# Patient Record
Sex: Female | Born: 1995 | Race: Black or African American | Hispanic: No | Marital: Single | Smoking: Former smoker
Health system: Southern US, Community
[De-identification: ages and names within clinical notes are randomized; demographics above are authoritative.]

## PROBLEM LIST (undated history)

## (undated) DIAGNOSIS — J45909 Unspecified asthma, uncomplicated: Secondary | ICD-10-CM

## (undated) DIAGNOSIS — D649 Anemia, unspecified: Secondary | ICD-10-CM

## (undated) DIAGNOSIS — T7840XA Allergy, unspecified, initial encounter: Secondary | ICD-10-CM

## (undated) HISTORY — DX: Allergy, unspecified, initial encounter: T78.40XA

## (undated) HISTORY — DX: Anemia, unspecified: D64.9

## (undated) HISTORY — PX: BREAST SURGERY: SHX581

## (undated) HISTORY — PX: WISDOM TOOTH EXTRACTION: SHX21

---
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
## 2016-02-22 ENCOUNTER — Encounter: Payer: Self-pay | Admitting: Emergency Medicine

## 2016-02-22 ENCOUNTER — Ambulatory Visit
Admission: EM | Admit: 2016-02-22 | Discharge: 2016-02-22 | Disposition: A | Discharge status: Home / Self Care | Payer: BLUE CROSS/BLUE SHIELD | Attending: Family Medicine | Admitting: Family Medicine

## 2016-02-22 DIAGNOSIS — R6889 Other general symptoms and signs: Secondary | ICD-10-CM

## 2016-02-22 DIAGNOSIS — R69 Illness, unspecified: Secondary | ICD-10-CM

## 2016-02-22 DIAGNOSIS — J111 Influenza due to unidentified influenza virus with other respiratory manifestations: Secondary | ICD-10-CM

## 2016-02-22 MED ORDER — MELOXICAM 15 MG PO TABS: 15.0000 mg | ORAL_TABLET | Freq: Every day | ORAL | 0 refills | Status: DC

## 2016-02-22 MED ORDER — BENZONATATE 200 MG PO CAPS: 200.0000 mg | ORAL_CAPSULE | Freq: Three times a day (TID) | ORAL | 0 refills | Status: DC | PRN

## 2016-02-22 MED ORDER — FEXOFENADINE-PSEUDOEPHED ER 180-240 MG PO TB24: 1.0000 | ORAL_TABLET | Freq: Every day | ORAL | 0 refills | Status: DC

## 2016-02-22 MED ORDER — OSELTAMIVIR PHOSPHATE 75 MG PO CAPS: 75.0000 mg | ORAL_CAPSULE | Freq: Two times a day (BID) | ORAL | 0 refills | Status: DC

## 2016-02-22 NOTE — ED Triage Notes (Signed)
Chills, fever, cough, runny nose. Congested, headache for 4 days

## 2016-02-22 NOTE — ED Provider Notes (Signed)
Laboratory dentition antibiotic flap was Assumed nasal congestion coughing  [facility]    CSN: [id number] Arrival date & time: 02/22/16  1755     History   Chief Complaint Chief Complaint  Patient presents with  . Influenza    HPI Deanna Barnes is a 21 y.o. female.   Dr. Thurmond ButtsWade doing dictation on this 21 year old black female who presents with nasal congestion coughing a pain and general malaise. She's also had a low-grade fever. Last week she did not feel well that she felt weak but she admits that she was on her cycle and that she's had a history of anemia. Things got better until Sunday. Sunday she states even since diagnosing a cough but it was on Monday morning when she had full-blown symptoms of flu achy all over coughing and nasal congestion sore throat and feeling miserable. Her mother repeatedly recommended go [place] Wednesday 2 days later she came in to be seen and evaluated. She has no pertinent past medical history no pertinent surgical history she never smoked. No known drug allergies. She's not been at work since Monday. No pertinent family medical history present.   The history is provided by the patient. No language interpreter was used.  Influenza  Presenting symptoms: cough, fever, headache, myalgias and rhinorrhea   Severity:  Moderate Progression:  Worsening Chronicity:  New Relieved by:  Nothing Worsened by:  Nothing Ineffective treatments:  None tried Associated symptoms: nasal congestion   Associated symptoms: no chills, no decreased appetite and no mental status change     History reviewed. No pertinent past medical history.  There are no active problems to display for this patient.   History reviewed. No pertinent surgical history.  OB History    No data available       Home Medications    Prior to Admission medications   Medication Sig Start Date End Date Taking? Authorizing Provider  benzonatate (TESSALON) 200 MG  capsule Take 1 capsule (200 mg total) by mouth 3 (three) times daily as needed. 02/22/16   Hassan RowanEugene Karyl Sharrar, MD  cyclobenzaprine (FLEXERIL) 10 MG tablet Take 1 tablet (10 mg total) by mouth 3 (three) times daily as needed for muscle spasms. 07/07/15   Darci Currentandolph N Brown, MD  fexofenadine-pseudoephedrine (ALLEGRA-D ALLERGY & CONGESTION) 180-240 MG 24 hr tablet Take 1 tablet by mouth daily. 02/22/16   Hassan RowanEugene Remigio Mcmillon, MD  fluconazole (DIFLUCAN) 150 MG tablet Take 1 tablet (150 mg total) by mouth once. Repeat the Diflucan in 2 weeks after the Flagyl 05/05/15   Hassan RowanEugene Mariana Wiederholt, MD  HYDROcodone-acetaminophen (NORCO/VICODIN) 5-325 MG tablet 1-2 tabs po qd prn 10/24/15   Payton Mccallumrlando Conty, MD  ketoconazole (NIZORAL) 2 % cream Apply 1 application topically 2 (two) times daily. 05/05/15   Hassan RowanEugene Osker Ayoub, MD  meloxicam (MOBIC) 15 MG tablet Take 1 tablet (15 mg total) by mouth daily. 02/22/16   Hassan RowanEugene Gohan Collister, MD  metroNIDAZOLE (FLAGYL) 500 MG tablet Take 1 tablet (500 mg total) by mouth 2 (two) times daily. 05/05/15   Hassan RowanEugene Matthew Pais, MD  oseltamivir (TAMIFLU) 75 MG capsule Take 1 capsule (75 mg total) by mouth 2 (two) times daily. 02/22/16   Hassan RowanEugene Egon Dittus, MD  terconazole (TERAZOL 3) 80 MG vaginal suppository Place 1 suppository (80 mg total) vaginally at bedtime. 05/05/15   Hassan RowanEugene Mende Biswell, MD    Family History No family history on file.  Social History Social History  Substance Use Topics  . Smoking status: Never Smoker  . Smokeless tobacco:  Never Used  . Alcohol use No     Allergies   Patient has no known allergies.   Review of Systems Review of Systems  Constitutional: Positive for fever. Negative for chills and decreased appetite.  HENT: Positive for congestion and rhinorrhea.   Respiratory: Positive for cough.   Musculoskeletal: Positive for myalgias.  Neurological: Positive for headaches.  All other systems reviewed and are negative.    Physical Exam Triage Vital Signs ED Triage Vitals  Enc Vitals Group     BP 02/22/16  1825 123/76     Pulse Rate 02/22/16 1825 88     Resp 02/22/16 1825 16     Temp 02/22/16 1825 99.4 F (37.4 C)     Temp Source 02/22/16 1825 Oral     SpO2 02/22/16 1825 100 %     Weight 02/22/16 1828 250 lb (113.4 kg)     Height 02/22/16 1828 5\' 8"  (1.727 m)     Head Circumference --      Peak Flow --      Pain Score 02/22/16 1829 0     Pain Loc --      Pain Edu? --      Excl. in GC? --    No data found.   Updated Vital Signs BP 123/76 (BP Location: Left Arm)   Pulse 88   Temp 99.4 F (37.4 C) (Oral)   Resp 16   Ht 5\' 8"  (1.727 m)   Wt 250 lb (113.4 kg)   LMP 02/19/2016   SpO2 100%   BMI 38.01 kg/m   Visual Acuity Right Eye Distance:   Left Eye Distance:   Bilateral Distance:    Right Eye Near:   Left Eye Near:    Bilateral Near:     Physical Exam  Constitutional: She is oriented to person, place, and time. She appears well-developed and well-nourished.  HENT:  Head: Normocephalic and atraumatic.  Right Ear: External ear normal.  Left Ear: External ear normal.  Mouth/Throat: Oropharynx is clear and moist.  Eyes: Pupils are equal, round, and reactive to light.  Neck: Normal range of motion. Neck supple.  Cardiovascular: Normal rate, regular rhythm and normal heart sounds.   Pulmonary/Chest: Effort normal and breath sounds normal.  Musculoskeletal: Normal range of motion.  Neurological: She is alert and oriented to person, place, and time.  Skin: Skin is warm and dry.  Psychiatric: She has a normal mood and affect.  Vitals reviewed.    UC Treatments / Results  Labs (all labs ordered are listed, but only abnormal results are displayed) Labs Reviewed - No data to display  EKG  EKG Interpretation None       Radiology No results found.  Procedures Procedures (including critical care time)  Medications Ordered in UC Medications - No data to display   Initial Impression / Assessment and Plan / UC Course  I have reviewed the triage vital signs  and the nursing notes.  Pertinent labs & imaging results that were available during my care of the patient were reviewed by me and considered in my medical decision making (see chart for details).   patient with flulike symptoms will treat with Tamiflu twice a day for 5 days Allegra-D daily and Tessalon Perles for cough and Mobic for the aches and pains. However follow-up PCP in 3-4 days not better work note given for today and tomorrow.,   Final Clinical Impressions(s) / UC Diagnoses   Final diagnoses:  Flu-like symptoms  Influenza-like illness    New Prescriptions New Prescriptions   BENZONATATE (TESSALON) 200 MG CAPSULE    Take 1 capsule (200 mg total) by mouth 3 (three) times daily as needed.   FEXOFENADINE-PSEUDOEPHEDRINE (ALLEGRA-D ALLERGY & CONGESTION) 180-240 MG 24 HR TABLET    Take 1 tablet by mouth daily.   MELOXICAM (MOBIC) 15 MG TABLET    Take 1 tablet (15 mg total) by mouth daily.   OSELTAMIVIR (TAMIFLU) 75 MG CAPSULE    Take 1 capsule (75 mg total) by mouth 2 (two) times daily.     Hassan Rowan, MD 02/22/16 [phone]

## 2016-10-01 ENCOUNTER — Ambulatory Visit
Admission: EM | Admit: 2016-10-01 | Discharge: 2016-10-01 | Disposition: A | Discharge status: Home / Self Care | Payer: Managed Care, Other (non HMO) | Attending: Family Medicine | Admitting: Family Medicine

## 2016-10-01 ENCOUNTER — Encounter: Payer: Self-pay | Admitting: Emergency Medicine

## 2016-10-01 DIAGNOSIS — N390 Urinary tract infection, site not specified: Secondary | ICD-10-CM

## 2016-10-01 DIAGNOSIS — R109 Unspecified abdominal pain: Secondary | ICD-10-CM

## 2016-10-01 LAB — URINALYSIS, COMPLETE (UACMP) WITH MICROSCOPIC
BILIRUBIN URINE: NEGATIVE
Glucose, UA: NEGATIVE mg/dL
Hgb urine dipstick: NEGATIVE
KETONES UR: NEGATIVE mg/dL
NITRITE: NEGATIVE
Protein, ur: NEGATIVE mg/dL
RBC / HPF: NONE SEEN RBC/hpf (ref 0–5)
Specific Gravity, Urine: >1.03 — ABNORMAL HIGH (ref 1.005–1.030)
pH: 5.5 (ref 5.0–8.0)

## 2016-10-01 LAB — PREGNANCY, URINE: Preg Test, Ur: NEGATIVE

## 2016-10-01 MED ORDER — CEPHALEXIN 500 MG PO CAPS: 500.0000 mg | ORAL_CAPSULE | Freq: Two times a day (BID) | ORAL | 0 refills | Status: AC

## 2016-10-01 NOTE — ED Provider Notes (Addendum)
[facility] ____________________________________________  Time seen: Approximately 9:18 AM  I have reviewed the triage vital signs and the nursing notes.   HISTORY  Chief Complaint GI Problem  HPI Deanna Barnes is a 21 y.o. female presenting for evaluation of stomach cramping that has been present for the last 3 days. 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 States she still is having abdominal cramping that comes and goes. States she is lactose intolerant and has been eating for cheese and dairy recently, which she expresses could be some of the discomfort. States she wanted to make sure she was fine. States some loose stool, but not diarrhea. States she has had two bowel movements today, and 3 yesterday. Denies vomiting or nausea. Denies known food trigger. 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 Denies sick contacts at home. Reports has continued to eat and drink well. Reports last menstrual was approximately 2 weeks ago, states she does not believe she is pregnant, but reports she and her boyfriend are not actively preventing pregnancy. States some urinary frequency, denies burning with urination or urinary urgency. Denies any vaginal discomfort, vaginal discharge. Denies vaginal bleeding. Has not been taking any over-the-counter medications for the same complaints. Denies other aggravating or alleviating factors. Reports otherwise feels well.  Denies chest pain, shortness of breath, extremity pain, extremity swelling or rash. Denies recent sickness. Denies recent antibiotic use.   Patient's last menstrual period was 09/13/2016 (exact date). Denies pregnancy.    Past medical history. Lactose Intolerance   There are no active problems to display for this patient.   History reviewed. No pertinent surgical history.   No current facility-administered medications for this encounter.    Current Outpatient Prescriptions:  .  cephALEXin (KEFLEX) 500 MG capsule, Take 1 capsule (500 mg total) by mouth 2 (two) times daily., Disp: 14 capsule, Rfl: 0  Allergies Patient has no known allergies.   family history Denies family with similar complaints.  Social History Social History  Substance Use Topics  . Smoking status: Never Smoker  . Smokeless tobacco: Never Used  . Alcohol use No    Review of Systems Constitutional: No fever/chills ENT: No sore throat. Cardiovascular: Denies chest pain. Respiratory: Denies shortness of breath. Gastrointestinal: As above.  Genitourinary: Negative for dysuria. Musculoskeletal: Negative for back pain. Skin: Negative for rash. ____________________________________________   PHYSICAL EXAM:  VITAL SIGNS: ED Triage Vitals  Enc Vitals Group     BP 10/01/16 0903 132/69     Pulse Rate 10/01/16 0903 71     Resp 10/01/16 0903 16     Temp 10/01/16 0903 98.2 F (36.8 C)     Temp Source 10/01/16 0903 Oral     SpO2 10/01/16 0903 100 %     Weight 10/01/16 0901 250 lb (113.4 kg)     Height 10/01/16 0901  (1.727 m)     Head Circumference --      Peak Flow --      Pain Score 10/01/16 0901 7     Pain Loc --      Pain Edu? --      Excl. in GC? --     Constitutional: Alert and oriented. Well appearing and in no acute distress. ENT      Head: Normocephalic and atraumatic.      Mouth/Throat: Mucous membranes are moist.Oropharynx non-erythematous. Cardiovascular: Normal rate, regular rhythm. Grossly  normal heart sounds.  Good peripheral circulation. Respiratory: Normal respiratory effort without tachypnea nor retractions. Breath sounds are clear and equal bilaterally. No wheezes, rales, rhonchi. Gastrointestinal: Minimal diffuse tenderness. No point tenderness. Normal Bowel sounds. No CVA tenderness. Musculoskeletal:  Steady gait.  No midline cervical, thoracic or lumbar tenderness to palpation. Neurologic:  Normal speech and  language.  Speech is normal. No gait instability.  Skin:  Skin is warm, dry. Psychiatric: Mood and affect are normal. Speech and behavior are normal. Patient exhibits appropriate insight and judgment   ___________________________________________   LABS (all labs ordered are listed, but only abnormal results are displayed)  Labs Reviewed  URINALYSIS, COMPLETE (UACMP) WITH MICROSCOPIC - Abnormal; Notable for the following:       Result Value   APPearance HAZY (*)    Specific Gravity, Urine >1.030 (*)    Leukocytes, UA SMALL (*)    Squamous Epithelial / LPF 6-30 (*)    Bacteria, UA MANY (*)    All other components within normal limits  URINE CULTURE  PREGNANCY, URINE    RADIOLOGY  No results found. ____________________________________________   PROCEDURES Procedures   INITIAL IMPRESSION / ASSESSMENT AND PLAN / ED COURSE  Pertinent labs & imaging results that were available during my care of the patient were reviewed by me and considered in my medical decision making (see chart for details).  Well-appearing patient. No acute distress. Urine pregnancy negative. Urine reviewed, concern for UTI. Will culture urine and empirically start patient on oral Keflex. Encouraged rest, fluids, avoidance of lactose foods. Discussed with patient, as long as no diarrhea, vomiting or fever may return to work tomorrow. Encourage for any continued or increased abdominal complaints seek reevaluation. Discussed indication, risks and benefits of medications with patient.  Discussed follow up with Primary care physician this week. Discussed follow up and return parameters including no resolution or any worsening concerns. Patient verbalized understanding and agreed to plan.   ____________________________________________   FINAL CLINICAL IMPRESSION(S) / ED DIAGNOSES  Final diagnoses:  Urinary tract infection without hematuria, site unspecified  Stomach discomfort     Discharge Medication List  as of 10/01/2016  9:49 AM    START taking these medications   Details  cephALEXin (KEFLEX) 500 MG capsule Take 1 capsule (500 mg total) by mouth 2 (two) times daily., Starting Mon 10/01/2016, Until Mon 10/08/2016, Normal        Note: This dictation was prepared with Dragon dictation along with smaller phrase technology. Any transcriptional errors that result from this process are unintentional.         Renford DillsMiller, Kennadi Albany, NP 10/01/16 1101    Renford DillsMiller, Ishia Tenorio, NP 10/01/16 1101

## 2016-10-01 NOTE — ED Triage Notes (Signed)
Patient c/o stomach cramps that started over the weekend.  Patient denies N/V/D.  Patient last bowel movement was this morning and was normal.

## 2016-10-01 NOTE — Discharge Instructions (Signed)
Take medication as prescribed. Rest. Drink plenty of fluids.  ° °Follow up with your primary care physician this week as needed. Return to Urgent care for new or worsening concerns.  ° °

## 2016-10-02 LAB — URINE CULTURE: CULTURE: NO GROWTH

## 2016-10-29 ENCOUNTER — Ambulatory Visit (INDEPENDENT_AMBULATORY_CARE_PROVIDER_SITE_OTHER): Payer: Managed Care, Other (non HMO)

## 2016-10-29 ENCOUNTER — Ambulatory Visit
Admission: EM | Admit: 2016-10-29 | Discharge: 2016-10-29 | Disposition: A | Discharge status: Home / Self Care | Payer: Managed Care, Other (non HMO) | Attending: Family Medicine | Admitting: Family Medicine

## 2016-10-29 ENCOUNTER — Encounter: Payer: Self-pay | Admitting: *Deleted

## 2016-10-29 DIAGNOSIS — S6991XA Unspecified injury of right wrist, hand and finger(s), initial encounter: Secondary | ICD-10-CM | POA: Diagnosis not present

## 2016-10-29 DIAGNOSIS — M79644 Pain in right finger(s): Secondary | ICD-10-CM | POA: Diagnosis not present

## 2016-10-29 DIAGNOSIS — W231XXA Caught, crushed, jammed, or pinched between stationary objects, initial encounter: Secondary | ICD-10-CM

## 2016-10-29 MED ORDER — TRAMADOL HCL 50 MG PO TABS: 50.0000 mg | ORAL_TABLET | Freq: Three times a day (TID) | ORAL | 0 refills | Status: DC | PRN

## 2016-10-29 NOTE — Discharge Instructions (Signed)
No fracture.  Ice. You can use Ibuprofen 800 mg if you like.  Tramadol for pain.  Take care  Dr. Adriana Simas

## 2016-10-29 NOTE — ED Provider Notes (Signed)
[facility]    CSN: [id number] Arrival date & time: 10/29/16  1710  History   Chief Complaint Chief Complaint  Patient presents with  . Finger Injury   HPI  21 year old female presents with the above complaint.  Patient states that earlier today, she accidentally closed her right index finger in a car door. She subsequently developed severe pain and swelling of the distal portion of the finger. Pain is severe. Very tender. She has been icing the area. No other medications or interventions tried. No reports of obvious open fracture. No other associated symptoms. No other complaints or concerns at this time.  PMH - Asthma, Anemia  History reviewed. No pertinent surgical history.  OB History    No data available     Home Medications    Family History History reviewed. No pertinent family history.  Social History Social History  Substance Use Topics  . Smoking status: Never Smoker  . Smokeless tobacco: Never Used  . Alcohol use No    Allergies   Patient has no known allergies.   Review of Systems Review of Systems  Constitutional: Negative.   Musculoskeletal:       Finger injury, swelling, pain.   Physical Exam Triage Vital Signs ED Triage Vitals [10/29/16 1718]  Enc Vitals Group     BP 138/84     Pulse Rate 84     Resp 16     Temp 98.3 F (36.8 C)     Temp Source Oral     SpO2 100 %     Weight 240 lb (108.9 kg)     Height  (1.727 m)     Head Circumference      Peak Flow      Pain Score 10     Pain Loc      Pain Edu?      Excl. in GC?    Updated Vital Signs BP 138/84 (BP Location: Left Arm)   Pulse 84   Temp 98.3 F (36.8 C) (Oral)   Resp 16   Ht  (1.727 m)   Wt 240 lb (108.9 kg)   LMP 10/15/2016 (Approximate) Comment: denies preg  SpO2 100%   BMI 36.49 kg/m   Physical Exam  Constitutional: She is oriented to person, place, and time. She appears well-developed. No distress.  Pulmonary/Chest: Effort normal. No  respiratory distress.  Musculoskeletal:  Right index finger - no tenderness at the MCP, PIP joints. No discrete tenderness at the DIP joint. She has significant tenderness and swelling distal to the DIP joint. Small subungual hematoma. Nail intact. Neurovascularly intact.  Neurological: She is alert and oriented to person, place, and time.  Psychiatric: She has a normal mood and affect.  Vitals reviewed.  UC Treatments / Results  Labs (all labs ordered are listed, but only abnormal results are displayed) Labs Reviewed - No data to display  EKG  EKG Interpretation None      Radiology Dg Finger Index Right  Result Date: 10/29/2016 CLINICAL DATA:  Shut right index finger in a car door. EXAM: RIGHT INDEX FINGER 2+V COMPARISON:  None. FINDINGS: Negative for fracture or dislocation. Alignment is normal. No focal soft tissue abnormality. No evidence for a radiopaque foreign body. IMPRESSION: No acute bone abnormality. Electronically Signed   By: Richarda Overlie M.D.   On: 10/29/2016 18:03    Procedures Procedures (including critical care time)  Medications Ordered in UC Medications - No data to display   Initial  Impression / Assessment and Plan / UC Course  I have reviewed the triage vital signs and the nursing notes.  Pertinent labs & imaging results that were available during my care of the patient were reviewed by me and considered in my medical decision making (see chart for details).    21 year old female presents with a finger injury. X-ray negative. Small subungual hematoma that I do not feel needs to be treated. Tramadol as needed for pain. Ice and elevation.  Final Clinical Impressions(s) / UC Diagnoses   Final diagnoses:  Injury of finger of right hand, initial encounter   New Prescriptions New Prescriptions   TRAMADOL (ULTRAM) 50 MG TABLET    Take 1 tablet (50 mg total) by mouth every 8 (eight) hours as needed.   Controlled Substance Prescriptions [place] Controlled  Substance Registry consulted? Yes, I have consulted the [place] Controlled Substances Registry for this patient. No recent narcotics. Okay to proceed with Tramadol. # 15 given.   Tommie Sams, [place] 10/29/16 1821

## 2016-10-29 NOTE — ED Triage Notes (Signed)
Closed right index finger in car door today. Right index finger now painful and edematous.

## 2016-12-23 ENCOUNTER — Other Ambulatory Visit: Payer: Self-pay

## 2016-12-23 ENCOUNTER — Ambulatory Visit
Admission: EM | Admit: 2016-12-23 | Discharge: 2016-12-23 | Disposition: A | Discharge status: Home / Self Care | Payer: Managed Care, Other (non HMO) | Attending: Emergency Medicine | Admitting: Emergency Medicine

## 2016-12-23 DIAGNOSIS — M62830 Muscle spasm of back: Secondary | ICD-10-CM

## 2016-12-23 MED ORDER — METHOCARBAMOL 750 MG PO TABS: 750.0000 mg | ORAL_TABLET | ORAL | 0 refills | Status: DC

## 2016-12-23 MED ORDER — IBUPROFEN 600 MG PO TABS: 600.0000 mg | ORAL_TABLET | Freq: Four times a day (QID) | ORAL | 0 refills | Status: DC | PRN

## 2016-12-23 NOTE — Discharge Instructions (Signed)
600 mg of ibuprofen with 1 g of Tylenol 3-4 times a day together as needed for pain.  You may try some deep tissue massage in addition to gentle stretching.  Try the Robaxin, which is a muscle relaxant.  Follow-up here with a primary care physician of your choice if not better in 1 week.  See list below.  Here is a list of primary care providers who are taking new patients:  Dr. Elizabeth Sauereanna Jones, Dr. Schuyler AmorWilliam Plonk [address] [place] [address] [phone]  [facility] [place] [address]  [place] [address]  [phone]  [facility] [address]  [place], [place] [address] [phone]  [facility] [place] [address]  [phone] [place], [place] [address]  Here are clinics/ other resources who will see you if you do not have insurance. Some have certain criteria that you must meet. Call them and find out what they are:  [facility]: [address] [place] [address] [place], [place] [address] Phone: [phone] Hours: First and Third Saturdays of each Month, 9 a.m. - 1 p.m.  [facility]: [address], [place], [place] [address] Phone: [phone] Hours: Tuesday, 4 p.m. - 8 p.m. Thursday, 1 p.m. - 8 p.m. Wednesday, 9 a.m. - [facility]  [facility] [address], [place], [place] [address] Phone: [phone] Pharmacy Phone Number: [phone] Dental Phone Number: [phone] [facility] Insurance Help: [phone]  Dental Hours: Monday - Thursday, 8 a.m. - 6 p.m.  [facility] [address] [place], [place] [address] Phone: [phone] Pharmacy Phone Number: [phone] [facility] Insurance Help: [phone]  [facility] [address] [place], [place] [address] Phone: [phone] Pharmacy Phone Number: [phone] [facility] Insurance Help: [phone]  [facility] [address] [place], [place] [address] Phone: [phone] [facility] Insurance  Help: [phone]   [facility] [address] [place], [place] [address] Phone: [phone]  Go to www.goodrx.com to look up your medications. This will give you a list of where you can find your prescriptions at the most affordable prices. Or ask the pharmacist what the cash price is, or if they have any other discount programs available to help make your medication more affordable. This can be less expensive than what you would pay with insurance.

## 2016-12-23 NOTE — ED Triage Notes (Signed)
Patient states she has woke up thursday and noticed left upper back pain radiating to shoulder. Patient states she thought she slept the wrong way. Pain is worse when laying down.

## 2016-12-23 NOTE — ED Provider Notes (Signed)
HPI  SUBJECTIVE:  Deanna Barnes is a 21 y.o. female who presents with 4 days of left upper back and shoulder pain described as stabbing, sharp, alternating with dull.  States that she feels as if she "pulled something".  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 She denies any trauma to her back.  She denies neck pain, chest pain, shortness of breath, fevers.  She reports some nasal congestion, rhinorrhea and cough, but no wheezing.  No numbness or tingling in her arm, no grip weakness.  No symptoms like this previously.  No alleviating factors.  She tried stretching.  Symptoms are worse with lying down on her left side, coughing.  It is not particularly associated with arm, neck movement or torso rotation.  Past medical history negative for diabetes, hypertension, MI, hypercholesterolemia.  LMP: 11/8.  Denies possibility of being pregnant.  PMD: None.  History reviewed. No pertinent past medical history.  History reviewed. No pertinent surgical history.  History reviewed. No pertinent family history.  Social History   Tobacco Use  . Smoking status: Never Smoker  . Smokeless tobacco: Never Used  Substance Use Topics  . Alcohol use: No  . Drug use: No    No current facility-administered medications for this encounter.   Current Outpatient Medications:  .  ibuprofen (ADVIL,MOTRIN) 600 MG tablet, Take 1 tablet (600 mg total) by mouth every 6 (six) hours as needed., Disp: 30 tablet, Rfl: 0 .  methocarbamol (ROBAXIN) 750 MG tablet, Take 1 tablet (750 mg total) by mouth every 4 (four) hours., Disp: 40 tablet, Rfl: 0  No Known Allergies   ROS  As noted in HPI.   Physical Exam  BP 125/69 (BP Location: Left Arm)   Pulse 82   Temp 98.6 F (37 C) (Oral)   Ht 5\' 8"  (1.727 m)   Wt 240 lb (108.9 kg)   LMP 11/26/2016 (Approximate)   SpO2 100%   BMI 36.49 kg/m   Constitutional: Well developed, well nourished, no acute  distress.  Obese. Eyes:  EOMI, conjunctiva normal bilaterally HENT: Normocephalic, atraumatic,mucus membranes moist Respiratory: Normal inspiratory effort Cardiovascular: Normal rate GI: nondistended skin: No rash the area of pain, skin intact Musculoskeletal: Tenderness over the scapula along teres minor.  Positive muscle spasm.  No rhomboid, trapezius muscle tenderness.  No tenderness over the latissimus dorsi.  No C-spine, T-spine tenderness.  No pain with range of motion of her neck or shoulder. Neurologic: Alert & oriented x 3, no focal neuro deficits Psychiatric: Speech and behavior appropriate   ED Course   Medications - No data to display  No orders of the defined types were placed in this encounter.   No results found for this or any previous visit (from the past 24 hour(s)). No results found.  ED Clinical Impression  Muscle spasm of back   ED Assessment/Plan  Presentation consistent with musculoskeletal cause of her symptoms.  Plan to send home with ibuprofen 600 mg with 1 g of Tylenol, Robaxin.  She will follow-up with a primary care or provider of her choice or return here if not better in 5-7 days.  Ordering primary care referral and will provide primary care referral list.  She will go to the ER if she gets worse.  Discussed  MDM, plan and followup with patient. Discussed sn/sx that should prompt return to the ED. patient agrees with plan.   Meds ordered this encounter  Medications  . methocarbamol (ROBAXIN) 750 MG tablet    Sig: Take 1 tablet (750 mg total) by mouth every 4 (four) hours.    Dispense:  40 tablet    Refill:  0  . ibuprofen (ADVIL,MOTRIN) 600 MG tablet    Sig: Take 1 tablet (600 mg total) by mouth every 6 (six) hours as needed.    Dispense:  30 tablet    Refill:  0    *This clinic note was created using Scientist, clinical (histocompatibility and immunogenetics)Dragon dictation software. Therefore, there may be occasional mistakes despite careful proofreading.   ?   Domenick GongMortenson, Elvin Mccartin,  MD 12/23/16 1745

## 2017-04-06 ENCOUNTER — Other Ambulatory Visit: Payer: Self-pay

## 2017-04-06 ENCOUNTER — Ambulatory Visit
Admission: EM | Admit: 2017-04-06 | Discharge: 2017-04-06 | Disposition: A | Discharge status: Home / Self Care | Payer: BLUE CROSS/BLUE SHIELD | Attending: Family Medicine | Admitting: Family Medicine

## 2017-04-06 ENCOUNTER — Encounter: Payer: Self-pay | Admitting: Gynecology

## 2017-04-06 DIAGNOSIS — Z202 Contact with and (suspected) exposure to infections with a predominantly sexual mode of transmission: Secondary | ICD-10-CM

## 2017-04-06 DIAGNOSIS — Z3202 Encounter for pregnancy test, result negative: Secondary | ICD-10-CM

## 2017-04-06 DIAGNOSIS — Z113 Encounter for screening for infections with a predominantly sexual mode of transmission: Secondary | ICD-10-CM | POA: Diagnosis not present

## 2017-04-06 HISTORY — DX: Unspecified asthma, uncomplicated: J45.909

## 2017-04-06 LAB — WET PREP, GENITAL
Clue Cells Wet Prep HPF POC: NONE SEEN
Sperm: NONE SEEN
Trich, Wet Prep: NONE SEEN
Yeast Wet Prep HPF POC: NONE SEEN

## 2017-04-06 LAB — PREGNANCY, URINE: Preg Test, Ur: NEGATIVE

## 2017-04-06 LAB — CHLAMYDIA/NGC RT PCR (ARMC ONLY)
CHLAMYDIA TR: NOT DETECTED
N gonorrhoeae: NOT DETECTED

## 2017-04-06 NOTE — ED Provider Notes (Signed)
[facility]    CSN: [id number] Arrival date & time: 04/06/17  1322     History   Chief Complaint Chief Complaint  Patient presents with  . SEXUALLY TRANSMITTED DISEASE    HPI Deanna Barnes is a 22 y.o. female.   Presents to the urgent care facility for evaluation of possible STD exposure.  Patient states she is just getting out of a relationship with her sexual partner.  She is not having any symptoms.  She denies any pain, vaginal discharge, bleeding.  She states she is late from her last menstrual period.  States she is approximately 11 days late.  HPI  Past Medical History:  Diagnosis Date  . Asthma     There are no active problems to display for this patient.   Past Surgical History:  Procedure Laterality Date  . WISDOM TOOTH EXTRACTION      OB History    No data available       Home Medications    Prior to Admission medications   Medication Sig Start Date End Date Taking? Authorizing Provider  ibuprofen (ADVIL,MOTRIN) 600 MG tablet Take 1 tablet (600 mg total) by mouth every 6 (six) hours as needed. 12/23/16  Yes Domenick GongMortenson, Ashley, MD  methocarbamol (ROBAXIN) 750 MG tablet Take 1 tablet (750 mg total) by mouth every 4 (four) hours. 12/23/16  Yes Domenick GongMortenson, Ashley, MD    Family History Family History  Problem Relation Age of Onset  . Asthma Mother   . Hypertension Father     Social History Social History   Tobacco Use  . Smoking status: Never Smoker  . Smokeless tobacco: Never Used  Substance Use Topics  . Alcohol use: No  . Drug use: Yes    Types: Marijuana     Allergies   Patient has no known allergies.   Review of Systems Review of Systems  Constitutional: Negative for fever.  Gastrointestinal: Negative for abdominal pain.  Genitourinary: Negative for dysuria, flank pain, frequency, menstrual problem, pelvic pain, vaginal bleeding, vaginal discharge and vaginal pain.  Skin: Negative for rash.  Neurological: Negative for  headaches.     Physical Exam Triage Vital Signs ED Triage Vitals  Enc Vitals Group     BP 04/06/17 1352 124/79     Pulse Rate 04/06/17 1352 98     Resp 04/06/17 1352 18     Temp 04/06/17 1352 98.7 F (37.1 C)     Temp Source 04/06/17 1352 Oral     SpO2 04/06/17 1352 100 %     Weight 04/06/17 1350 280 lb (127 kg)     Height 04/06/17 1350 5\' 8"  (1.727 m)     Head Circumference --      Peak Flow --      Pain Score 04/06/17 1349 0     Pain Loc --      Pain Edu? --      Excl. in GC? --    No data found.  Updated Vital Signs BP 124/79 (BP Location: Left Arm)   Pulse 98   Temp 98.7 F (37.1 C) (Oral)   Resp 18   Ht 5\' 8"  (1.727 m)   Wt 280 lb (127 kg)   LMP 02/27/2017   SpO2 100%   BMI 42.57 kg/m   Visual Acuity Right Eye Distance:   Left Eye Distance:   Bilateral Distance:    Right Eye Near:   Left Eye Near:    Bilateral Near:  Physical Exam  Constitutional: She is oriented to person, place, and time. She appears well-developed and well-nourished.  HENT:  Head: Normocephalic and atraumatic.  Eyes: Conjunctivae are normal.  Neck: Normal range of motion.  Cardiovascular: Normal rate.  Pulmonary/Chest: Effort normal. No respiratory distress.  Musculoskeletal: Normal range of motion.  Neurological: She is alert and oriented to person, place, and time.  Skin: Skin is warm. No rash noted.  Psychiatric: She has a normal mood and affect. Her behavior is normal. Thought content normal.     UC Treatments / Results  Labs (all labs ordered are listed, but only abnormal results are displayed) Labs Reviewed  WET PREP, GENITAL - Abnormal; Notable for the following components:      Result Value   WBC, Wet Prep HPF POC MODERATE (*)    All other components within normal limits  CHLAMYDIA/NGC RT PCR ([facility] ONLY)  PREGNANCY, URINE  RPR  HIV ANTIBODY (ROUTINE TESTING)    EKG  EKG Interpretation None       Radiology No results  found.  Procedures Procedures (including critical care time)  Medications Ordered in UC Medications - No data to display   Initial Impression / Assessment and Plan / UC Course  I have reviewed the triage vital signs and the nursing notes.  Pertinent labs & imaging results that were available during my care of the patient were reviewed by me and considered in my medical decision making (see chart for details).     22 year old female with possible exposure to STD.  She is coming out of a relationship with her partner and would like to be checked for HIV, STDs, wet prep and pregnancy test.  Urine pregnancy test is negative.  Wet prep showed no clue cells, trichomonas but did show some white blood cells.  Patient denies any symptoms of dysuria, vaginal discharge, pain.  HIV, RPR, gonorrhea, chlamydia results will result in the next couple of days, we will notify patient of results.  Final Clinical Impressions(s) / UC Diagnoses   Final diagnoses:  Possible exposure to STD    ED Discharge Orders    None         Evon Slack, [place] 04/06/17 1428

## 2017-04-06 NOTE — ED Triage Notes (Signed)
Per patient would like testing for STDs. Pt. Request a wet prep and HIV testing.

## 2017-04-09 LAB — RPR: RPR Ser Ql: NONREACTIVE

## 2017-04-09 LAB — HIV ANTIBODY (ROUTINE TESTING W REFLEX): HIV Screen 4th Generation wRfx: NONREACTIVE

## 2017-09-11 ENCOUNTER — Other Ambulatory Visit: Payer: Self-pay

## 2017-09-11 ENCOUNTER — Ambulatory Visit
Admission: EM | Admit: 2017-09-11 | Discharge: 2017-09-11 | Disposition: A | Discharge status: Home / Self Care | Payer: BLUE CROSS/BLUE SHIELD | Attending: Emergency Medicine | Admitting: Emergency Medicine

## 2017-09-11 ENCOUNTER — Ambulatory Visit (INDEPENDENT_AMBULATORY_CARE_PROVIDER_SITE_OTHER): Payer: BLUE CROSS/BLUE SHIELD

## 2017-09-11 ENCOUNTER — Encounter: Payer: Self-pay | Admitting: Emergency Medicine

## 2017-09-11 DIAGNOSIS — S161XXA Strain of muscle, fascia and tendon at neck level, initial encounter: Secondary | ICD-10-CM | POA: Diagnosis not present

## 2017-09-11 DIAGNOSIS — S20221A Contusion of right back wall of thorax, initial encounter: Secondary | ICD-10-CM

## 2017-09-11 MED ORDER — CYCLOBENZAPRINE HCL 10 MG PO TABS: 10.0000 mg | ORAL_TABLET | Freq: Three times a day (TID) | ORAL | 0 refills | Status: DC | PRN

## 2017-09-11 MED ORDER — IBUPROFEN 600 MG PO TABS: 600.0000 mg | ORAL_TABLET | Freq: Four times a day (QID) | ORAL | 0 refills | Status: DC | PRN
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
## 2017-09-11 NOTE — Discharge Instructions (Addendum)
Your x-ray was negative for rib fracture, a bruise in your lung, or a collapsed lung.  People tend to feel worse over the next several days, but most people are back to normal in 1 week. A small number of people will have persistent pain for up to six weeks. Take the 600 mg of ibuprofen with 1 gram of tylenol 3-4 times a day.  Exparel as needed.  Follow-up with a primary care provider of your choice.  See list below.  Here is a list of primary care providers who are taking new patients:  Dr. Elizabeth Sauereanna Jones, Dr. Schuyler AmorWilliam Plonk [address] [place] [address] [phone]  [facility] [place] [address]  [place] [address]  [phone]  [facility] [address]  [place], [place] [address] [phone]  [facility] [place] [address]  [phone] [place], [place] [address]  Here are clinics/ other resources who will see you if you do not have insurance. Some have certain criteria that you must meet. Call them and find out what they are:  [facility]: [address] [place], [place] [address] Phone: [phone] Hours: First and Third Saturdays of each Month, 9 a.m. - 1 p.m.  [facility]: [address], [place], [place] [address] Phone: [phone] Hours: Tuesday, 4 p.m. - 8 p.m. Thursday, 1 p.m. - 8 p.m. Wednesday, 9 a.m. - [facility]  [facility] [address], [place], [place] [address] Phone: [phone] Pharmacy Phone Number: [phone] Dental Phone Number: [phone] [facility] Insurance Help: [phone]  Dental Hours: Monday - Thursday, 8 a.m. - 6 p.m.  [facility] [address] [place], [place] [address] Phone: [phone] Pharmacy Phone Number: [phone] [facility] Insurance Help: [phone]  [facility] [address] [place], [place] [address] Phone: [phone] Pharmacy Phone Number: [phone] [facility] Insurance Help:  [phone]  [facility] [address] [place], [place] [address] Phone: [phone] [facility] Insurance Help: [phone]   [facility] [address] [place], [place] [address] Phone: [phone]  Go to www.goodrx.com to look up your medications. This will give you a list of where you can find your prescriptions at the most affordable prices. Or ask the pharmacist what the cash price is, or if they have any other discount programs available to help make your medication more affordable. This can be less expensive than what you would pay with insurance.

## 2017-11-15 ENCOUNTER — Encounter: Payer: Self-pay | Admitting: Emergency Medicine

## 2017-11-15 ENCOUNTER — Other Ambulatory Visit: Payer: Self-pay

## 2017-11-15 ENCOUNTER — Ambulatory Visit
Admission: EM | Admit: 2017-11-15 | Discharge: 2017-11-15 | Disposition: A | Discharge status: Home / Self Care | Payer: BLUE CROSS/BLUE SHIELD | Attending: Family Medicine | Admitting: Family Medicine

## 2017-11-15 DIAGNOSIS — L02419 Cutaneous abscess of limb, unspecified: Secondary | ICD-10-CM

## 2017-11-15 DIAGNOSIS — L02412 Cutaneous abscess of left axilla: Secondary | ICD-10-CM | POA: Diagnosis not present

## 2017-11-15 MED ORDER — DOXYCYCLINE HYCLATE 100 MG PO CAPS: 100.0000 mg | ORAL_CAPSULE | Freq: Two times a day (BID) | ORAL | 0 refills | Status: DC

## 2017-11-15 NOTE — Discharge Instructions (Signed)
Remove packing in 2 days.  Antibiotic as prescribed.  Daily dressing change.  Take care  Dr. Adriana Simas

## 2017-11-15 NOTE — ED Triage Notes (Signed)
Patient c/o boil under left armpit area and is requesting this to be drained.

## 2017-11-15 NOTE — ED Provider Notes (Signed)
[facility]    CSN: [id number] Arrival date & time: 11/15/17  1512  History   Chief Complaint Chief Complaint  Patient presents with  . Recurrent Skin Infections   HPI  22 year old female presents with an abscess to her left axilla.  Patient reports a 4-day history of abscess.  Location: Left axilla.  She has tried home remedies without resolution.  No fever.  No chills.  She has had these before but has never had one drained.  She states that it is preventing her from lifting her arm effectively.  Pain is moderate at this time.  No current drainage.  No other reported symptoms.  No other complaints.  PMH, Surgical Hx, Family Hx, Social History reviewed and updated as below.  Past Medical History:  Diagnosis Date  . Asthma    Past Surgical History:  Procedure Laterality Date  . WISDOM TOOTH EXTRACTION     OB History   None    Home Medications    Prior to Admission medications   Medication Sig Start Date End Date Taking? Authorizing Provider  doxycycline (VIBRAMYCIN) 100 MG capsule Take 1 capsule (100 mg total) by mouth 2 (two) times daily. 11/15/17   Tommie Sams, DO   Family History Family History  Problem Relation Age of Onset  . Asthma Mother   . Hypertension Father    Social History Social History   Tobacco Use  . Smoking status: Never Smoker  . Smokeless tobacco: Never Used  Substance Use Topics  . Alcohol use: No  . Drug use: Yes    Types: Marijuana   Allergies   Patient has no known allergies.   Review of Systems Review of Systems  Constitutional: Negative.   Skin:       Abscess.   Physical Exam Triage Vital Signs ED Triage Vitals [11/15/17 1523]  Enc Vitals Group     BP (!) 132/99     Pulse Rate 80     Resp 18     Temp 98.2 F (36.8 C)     Temp Source Oral     SpO2 100 %     Weight 260 lb (117.9 kg)     Height 5\' 8"  (1.727 m)     Head Circumference      Peak Flow      Pain Score 8     Pain Loc      Pain Edu?    Excl. in GC?    Updated Vital Signs BP (!) 132/99 (BP Location: Left Arm)   Pulse 80   Temp 98.2 F (36.8 C) (Oral)   Resp 18   Ht 5\' 8"  (1.727 m)   Wt 117.9 kg   LMP 10/25/2017   SpO2 100%   BMI 39.53 kg/m   Visual Acuity Right Eye Distance:   Left Eye Distance:   Bilateral Distance:    Right Eye Near:   Left Eye Near:    Bilateral Near:     Physical Exam  Constitutional: She is oriented to person, place, and time. She appears well-developed. No distress.  HENT:  Head: Normocephalic and atraumatic.  Pulmonary/Chest: Effort normal. No respiratory distress.  Neurological: She is alert and oriented to person, place, and time.  Skin:  Left axilla with a large abscess. 7 x 3.5 cm area of induration.  Fluctuance noted.  Mild erythema.  Tender to palpation.  Psychiatric: She has a normal mood and affect. Her behavior is normal.  Nursing note and vitals  reviewed.  UC Treatments / Results  Labs (all labs ordered are listed, but only abnormal results are displayed) Labs Reviewed - No data to display  EKG None  Radiology No results found.  Procedures Incision and Drainage Date/Time: 11/15/2017 4:51 PM Performed by: Tommie Sams, DO Authorized by: Tommie Sams, DO   Consent:    Consent obtained:  Verbal   Consent given by:  Patient Location:    Type:  Abscess   Location:  Upper extremity   Upper extremity location: Left axilla. Pre-procedure details:    Skin preparation:  Betadine Anesthesia (see MAR for exact dosages):    Anesthesia method:  Local infiltration   Local anesthetic:  Lidocaine 1% w/o epi Procedure type:    Complexity:  Simple Procedure details:    Incision types:  Stab incision   Scalpel blade:  11   Wound management:  Probed and deloculated   Drainage:  Purulent and bloody   Drainage amount:  Copious   Packing materials:  1/4 in gauze Post-procedure details:    Patient tolerance of procedure:  Tolerated well, no immediate  complications   (including critical care time)  Medications Ordered in UC Medications - No data to display  Initial Impression / Assessment and Plan / UC Course  I have reviewed the triage vital signs and the nursing notes.  Pertinent labs & imaging results that were available during my care of the patient were reviewed by me and considered in my medical decision making (see chart for details).    22 year old female presents with abscess.  Drained today.  Placing on doxycycline.  Patient can remove packing in 2 days.  Information given regarding care.  Final Clinical Impressions(s) / UC Diagnoses   Final diagnoses:  Axillary abscess     Discharge Instructions     Remove packing in 2 days.  Antibiotic as prescribed.  Daily dressing change.  Take care  Dr. Adriana Simas    ED Prescriptions    Medication Sig Dispense Auth. Provider   doxycycline (VIBRAMYCIN) 100 MG capsule Take 1 capsule (100 mg total) by mouth 2 (two) times daily. 14 capsule Tommie Sams, DO     Controlled Substance Prescriptions [place] Controlled Substance Registry consulted? Not Applicable   Tommie Sams, DO 11/15/17 1652

## 2017-11-17 ENCOUNTER — Ambulatory Visit
Admission: EM | Admit: 2017-11-17 | Discharge: 2017-11-17 | Disposition: A | Discharge status: Home / Self Care | Payer: BLUE CROSS/BLUE SHIELD | Attending: Family Medicine | Admitting: Family Medicine

## 2017-11-17 DIAGNOSIS — Z5189 Encounter for other specified aftercare: Secondary | ICD-10-CM

## 2017-11-17 NOTE — Discharge Instructions (Signed)
Normal bathing.  Finish antibiotic.  Take care  Dr. Adriana Simas

## 2017-11-17 NOTE — ED Provider Notes (Signed)
  [facility]    CSN: [id number] Arrival date & time: 11/17/17  1548  History   Chief Complaint Chief Complaint  Patient presents with  . Wound Check   HPI   22 year old female presents with for a wound check.  Patient had incision and drainage performed on the 25th by me for an axillary abscess.  She is doing well.  Pain is much improved.  No fever.  Patient here for reassessment and packing removal.  Social History Social History   Tobacco Use  . Smoking status: Never Smoker  . Smokeless tobacco: Never Used  Substance Use Topics  . Alcohol use: No  . Drug use: Yes    Types: Marijuana     Allergies   Patient has no known allergies.   Review of Systems Review of Systems  Constitutional: Negative.   Skin: Positive for wound.   Physical Exam Triage Vital Signs ED Triage Vitals  Enc Vitals Group     BP 11/17/17 1558 (!) 135/93     Pulse Rate 11/17/17 1558 87     Resp 11/17/17 1558 18     Temp 11/17/17 1558 98.1 F (36.7 C)     Temp Source 11/17/17 1558 Oral     SpO2 11/17/17 1558 100 %     Weight --      Height --      Head Circumference --      Peak Flow --      Pain Score 11/17/17 1600 0     Pain Loc --      Pain Edu? --      Excl. in GC? --    Updated Vital Signs BP (!) 135/93 (BP Location: Right Arm)   Pulse 87   Temp 98.1 F (36.7 C) (Oral)   Resp 18   LMP 10/25/2017   SpO2 100%   Visual Acuity Right Eye Distance:   Left Eye Distance:   Bilateral Distance:    Right Eye Near:   Left Eye Near:    Bilateral Near:     Physical Exam  Constitutional: She appears well-developed. No distress.  Skin:  Left axilla -abscess is dramatically improved.  Packing removed.  Mild amount of purulent discharge noted with packing removal.  Nursing note and vitals reviewed.  UC Treatments / Results  Labs (all labs ordered are listed, but only abnormal results are displayed) Labs Reviewed - No data to display  EKG None  Radiology No  results found.  Procedures Procedures (including critical care time)  Medications Ordered in UC Medications - No data to display  Initial Impression / Assessment and Plan / UC Course  I have reviewed the triage vital signs and the nursing notes.  Pertinent labs & imaging results that were available during my care of the patient were reviewed by me and considered in my medical decision making (see chart for details).    22 year old female presents for a wound check regarding recent incision and drainage for axillary abscess.  Doing well.  Packing removed.  No further packing needed.  Finish antibiotic. Supportive care.  Final Clinical Impressions(s) / UC Diagnoses   Final diagnoses:  Visit for wound check     Discharge Instructions     Normal bathing.  Finish antibiotic.  Take care  Dr. Adriana Simas    ED Prescriptions    None     Controlled Substance Prescriptions [place] Controlled Substance Registry consulted? Not Applicable  Tommie Sams, DO 11/17/17 1636

## 2017-11-17 NOTE — ED Triage Notes (Signed)
Pt here to get her packing removed from her left armpit area. No drainage reported and hasn't uncovered it since Friday.

## 2018-11-04 ENCOUNTER — Ambulatory Visit
Admission: EM | Admit: 2018-11-04 | Discharge: 2018-11-04 | Disposition: A | Discharge status: Home / Self Care | Payer: BC Managed Care – PPO

## 2018-11-04 ENCOUNTER — Other Ambulatory Visit: Payer: Self-pay

## 2018-11-04 ENCOUNTER — Encounter: Payer: Self-pay | Admitting: Emergency Medicine

## 2018-11-04 DIAGNOSIS — T2005XA Burn of unspecified degree of scalp [any part], initial encounter: Secondary | ICD-10-CM | POA: Diagnosis not present

## 2018-11-04 DIAGNOSIS — Y92513 Shop (commercial) as the place of occurrence of the external cause: Secondary | ICD-10-CM | POA: Diagnosis not present

## 2018-11-04 NOTE — Discharge Instructions (Signed)
The areas seem to be superficial. No signs of infection. May use Tylenol and/or Ibuprofen as needed for pain. May apply vitamin E oil to your scalp. If concerned about developing infection, may apply antibiotic ointment. Monitor for signs and symptoms of infection, which would include increased redness, swelling, streaking, drainage, pain, and the development of a fever.   Honor Loh, MSN, APRN, FNP-C, CEN Advanced Practice Provider [facility]
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
## 2018-11-06 ENCOUNTER — Ambulatory Visit
Admission: EM | Admit: 2018-11-06 | Discharge: 2018-11-06 | Disposition: A | Discharge status: Home / Self Care | Payer: BC Managed Care – PPO | Attending: Urgent Care | Admitting: Urgent Care

## 2018-11-06 ENCOUNTER — Encounter: Payer: Self-pay | Admitting: Emergency Medicine

## 2018-11-06 ENCOUNTER — Other Ambulatory Visit: Payer: Self-pay

## 2018-11-06 DIAGNOSIS — W228XXA Striking against or struck by other objects, initial encounter: Secondary | ICD-10-CM

## 2018-11-06 DIAGNOSIS — S40011A Contusion of right shoulder, initial encounter: Secondary | ICD-10-CM | POA: Diagnosis not present

## 2018-11-06 MED ORDER — KETOROLAC TROMETHAMINE 10 MG PO TABS: 10.0000 mg | ORAL_TABLET | Freq: Three times a day (TID) | ORAL | 0 refills | Status: DC | PRN
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
## 2018-11-06 NOTE — Discharge Instructions (Addendum)
It was very nice seeing you today in clinic. Thank you for entrusting me with your care.   As discussed, your pain seems to be musculoskeletal in nature. Plans for treating you are as follows:  Please utilize the medications that we discussed. Your prescriptions have been called in to your pharmacy.  Avoid overdoing it, but you need to make efforts to remain active as tolerated.  Avoiding activity all together can make your pain worse. You may find that alternating between ice and moist heat application will help with your pain.  Heat/ice should be applied for 15-20 minutes at a time at least 3-4 times a day.  Make arrangements to follow up with your regular doctor in 1 week for re-evaluation. If your symptoms/condition worsens, please seek follow up care either here or in the ER. Please remember, our [facility] providers are "right here with you" when you need us.   Again, it was my pleasure to take care of you today. Thank you for choosing our clinic. I hope that you start to feel better quickly.   Tomoko Sandra, MSN, APRN, FNP-C, CEN Advanced Practice Provider [facility] 
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
## 2019-07-29 ENCOUNTER — Ambulatory Visit
Admission: RE | Admit: 2019-07-29 | Discharge: 2019-07-29 | Disposition: A | Discharge status: Home / Self Care | Payer: Self-pay | Source: Ambulatory Visit | Attending: Nurse Practitioner | Admitting: Nurse Practitioner

## 2019-07-29 ENCOUNTER — Other Ambulatory Visit: Payer: Self-pay

## 2019-07-29 VITALS — BP 149/103 | HR 92 | Temp 98.2°F | Resp 18

## 2019-07-29 DIAGNOSIS — H9201 Otalgia, right ear: Secondary | ICD-10-CM

## 2019-07-29 DIAGNOSIS — T161XXA Foreign body in right ear, initial encounter: Secondary | ICD-10-CM

## 2019-07-29 NOTE — ED Provider Notes (Signed)
[facility]    CSN: [id number] Arrival date & time: 07/29/19  1908      History   Chief Complaint Chief Complaint  Patient presents with  . Appointment  . Otalgia    HPI Deanna Barnes is a 24 y.o. female.   Subjective:   Deanna Barnes is a 24 y.o. female who presents for possible ear infection. Symptoms include plugged sensation in the right ear. Onset of symptoms was 2 weeks ago and has been unchanged since that time. Patient denies any facial pain, nasal congestion, post nasal drip, sinus pressure, sneezing or sore throat. Patient denies feeling ill.   The following portions of the patient's history were reviewed and updated as appropriate: allergies, current medications, past family history, past medical history, past social history, past surgical history and problem list.       Past Medical History:  Diagnosis Date  . Asthma     There are no problems to display for this patient.   Past Surgical History:  Procedure Laterality Date  . WISDOM TOOTH EXTRACTION      OB History   No obstetric history on file.      Home Medications    Prior to Admission medications   Not on File    Family History Family History  Problem Relation Age of Onset  . Asthma Mother   . Hypertension Father     Social History Social History   Tobacco Use  . Smoking status: Former Games developer  . Smokeless tobacco: Never Used  Vaping Use  . Vaping Use: Never used  Substance Use Topics  . Alcohol use: No  . Drug use: Yes    Types: Marijuana     Allergies   Patient has no known allergies.   Review of Systems Review of Systems  Constitutional: Negative.   HENT: Positive for ear pain.   Respiratory: Negative.   Cardiovascular: Negative.   Gastrointestinal: Negative.   Musculoskeletal: Negative.   Skin: Negative.   Neurological: Negative.   All other systems reviewed and are negative.    Physical Exam Triage Vital Signs ED Triage Vitals [07/29/19  1930]  Enc Vitals Group     BP (!) 149/103     Pulse Rate 92     Resp 18     Temp 98.2 F (36.8 C)     Temp src      SpO2 100 %     Weight      Height      Head Circumference      Peak Flow      Pain Score 6     Pain Loc      Pain Edu?      Excl. in GC?    No data found.  Updated Vital Signs BP (!) 149/103   Pulse 92   Temp 98.2 F (36.8 C)   Resp 18   LMP 07/29/2019   SpO2 100%   Visual Acuity Right Eye Distance:   Left Eye Distance:   Bilateral Distance:    Right Eye Near:   Left Eye Near:    Bilateral Near:     Physical Exam Vitals and nursing note reviewed.  Constitutional:      General: She is not in acute distress.    Appearance: Normal appearance. She is obese. She is not ill-appearing, toxic-appearing or diaphoretic.  HENT:     Head: Normocephalic.     Right Ear: Hearing normal. There is impacted cerumen.  Nose: Nose normal.     Mouth/Throat:     Mouth: Mucous membranes are moist.  Eyes:     Conjunctiva/sclera: Conjunctivae normal.     Pupils: Pupils are equal, round, and reactive to light.  Cardiovascular:     Rate and Rhythm: Normal rate and regular rhythm.  Pulmonary:     Effort: Pulmonary effort is normal.     Breath sounds: Normal breath sounds.  Musculoskeletal:        General: Normal range of motion.     Cervical back: Normal range of motion and neck supple.  Skin:    General: Skin is warm and dry.  Neurological:     General: No focal deficit present.     Mental Status: She is alert and oriented to person, place, and time.  Psychiatric:        Mood and Affect: Mood normal.      UC Treatments / Results  Labs (all labs ordered are listed, but only abnormal results are displayed) Labs Reviewed - No data to display  EKG   Radiology No results found.  Procedures Ear Cerumen Removal  Date/Time: 07/29/2019 8:21 PM Performed by: Suzan Nailer, RN Authorized by: Lurline Idol, FNP   Consent:    Consent obtained:   Verbal   Consent given by:  Patient   Risks discussed:  Bleeding, infection, pain, dizziness, incomplete removal and TM perforation   Alternatives discussed:  No treatment Procedure details:    Location:  R ear   Procedure type: irrigation   Post-procedure details:    Inspection:  TM intact (Q-tip cotton removed from ear)   Hearing quality:  Improved   Patient tolerance of procedure:  Tolerated well, no immediate complications Comments:     Patient feels significantly better after having the foreign body removed and ear irrigated   (including critical care time)  Medications Ordered in UC Medications - No data to display  Initial Impression / Assessment and Plan / UC Course  I have reviewed the triage vital signs and the nursing notes.  Pertinent labs & imaging results that were available during my care of the patient were reviewed by me and considered in my medical decision making (see chart for details).    24 yo female presenting with a plugged sensation in the right ear for the past couple of weeks.  Denies any other symptoms.  On exam, unable to visualize TM, initially thought to be due to cerumen impaction; however, irrigation completed by nursing revealed a foreign body which was easily removed.  Patient states that she did try to clean her ears with cotton ball.  Large piece of cotton was removed in the patient's ear.  Subjectively feels better after this.  Postprocedural assessment reveals normal ear.  Advised to not put anything in the ear larger than her fingertip.  Patient verbalizes understanding  Today's evaluation has revealed no signs of a dangerous process. Discussed diagnosis with patient and/or guardian. Patient and/or guardian aware of their diagnosis, possible red flag symptoms to watch out for and need for close follow up. Patient and/or guardian understands verbal and written discharge instructions. Patient and/or guardian comfortable with plan and disposition.   Patient and/or guardian has a clear mental status at this time, good insight into illness (after discussion and teaching) and has clear judgment to make decisions regarding their care  This care was provided during an unprecedented National Emergency due to the Novel Coronavirus (COVID-19) pandemic. COVID-19 infections and transmission risks  place heavy strains on healthcare resources.  As this pandemic evolves, our facility, providers, and staff strive to respond fluidly, to remain operational, and to provide care relative to available resources and information. Outcomes are unpredictable and treatments are without well-defined guidelines. Further, the impact of COVID-19 on all aspects of urgent care, including the impact to patients seeking care for reasons other than COVID-19, is unavoidable during this national emergency. At this time of the global pandemic, management of patients has significantly changed, even for non-COVID positive patients given high local and regional COVID volumes at this time requiring high healthcare system and resource utilization. The standard of care for management of both COVID suspected and non-COVID suspected patients continues to change rapidly at the local, regional, national, and global levels. This patient was worked up and treated to the best available but ever changing evidence and resources available at this current time.   Documentation was completed with the aid of voice recognition software. Transcription may contain typographical errors.  Final Clinical Impressions(s) / UC Diagnoses   Final diagnoses:  Foreign body of right ear, initial encounter  Otalgia of right ear     Discharge Instructions     To prevent getting objects stuck in your ear: ? Do not put anything into your ear, including cotton swabs :-)     ED Prescriptions    None     PDMP not reviewed this encounter.   Lurline Idol, [place] 07/29/19 2025

## 2019-07-29 NOTE — Discharge Instructions (Signed)
To prevent getting objects stuck in your ear: Do not put anything into your ear, including cotton swabs :-)

## 2019-07-30 ENCOUNTER — Ambulatory Visit
Admission: EM | Admit: 2019-07-30 | Discharge: 2019-07-30 | Disposition: A | Discharge status: Home / Self Care | Payer: Medicaid Other

## 2019-07-30 ENCOUNTER — Other Ambulatory Visit: Payer: Self-pay

## 2019-07-30 DIAGNOSIS — G44209 Tension-type headache, unspecified, not intractable: Secondary | ICD-10-CM

## 2019-07-30 NOTE — ED Provider Notes (Signed)
[facility]    CSN: [id number] Arrival date & time: 07/30/19  1953      History   Chief Complaint Chief Complaint  Patient presents with  . Headache    HPI Deanna Barnes is a 24 y.o. female who presents today for evaluation of headache.  The patient was seen yesterday for right ear pain.  The patient states that part of a Q-tip was removed from the right ear yesterday.  The right ear was then flushed with saline.  Patient return home however the patient began to experience a headache this morning and she stayed home from work.  The patient states that the headache was across her forehead, she denies any vision changes or neurologic symptoms.  Denies any numbness or tingling to bilateral upper extremities or weakness of her face.  The patient states that the headache is now extremely mild however because she missed work she was instructed to return to the [facility] urgent [facility] for evaluation and possible treatment.  She does have a history of migraines in the past but she states that typically in the past she would have vision changes.  She is taken to ibuprofen which has been providing relief, she has also been sleeping on and off throughout the day.  No recent trauma or injury affecting her head, headaches did not start after a fall.  No recent loss of consciousness.  HPI  Past Medical History:  Diagnosis Date  . Asthma     There are no problems to display for this patient.   Past Surgical History:  Procedure Laterality Date  . WISDOM TOOTH EXTRACTION      OB History   No obstetric history on file.      Home Medications    Prior to Admission medications   Not on File    Family History Family History  Problem Relation Age of Onset  . Asthma Mother   . Hypertension Father     Social History Social History   Tobacco Use  . Smoking status: Former Games developer  . Smokeless tobacco: Never Used  Vaping Use  . Vaping Use: Never used  Substance Use Topics  .  Alcohol use: No  . Drug use: Yes    Types: Marijuana     Allergies   Patient has no known allergies.   Review of Systems Review of Systems  Constitutional: Negative for diaphoresis and fatigue.  HENT: Negative.   Eyes: Negative.   Respiratory: Negative.   Cardiovascular: Negative.   Gastrointestinal: Negative.   Endocrine: Negative.   Genitourinary: Negative.   Musculoskeletal: Negative.   Skin: Negative.   Allergic/Immunologic: Negative.   Neurological: Positive for headaches. Negative for tremors, seizures, syncope, speech difficulty, weakness and numbness.  Hematological: Negative.   Psychiatric/Behavioral: Negative.    Physical Exam Triage Vital Signs ED Triage Vitals  Enc Vitals Group     BP 07/30/19 2005 (!) 136/91     Pulse Rate 07/30/19 2005 90     Resp 07/30/19 2005 16     Temp 07/30/19 2005 98.7 F (37.1 C)     Temp Source 07/30/19 2005 Oral     SpO2 07/30/19 2005 100 %     Weight 07/30/19 2004 260 lb (117.9 kg)     Height 07/30/19 2004 5\' 9"  (1.753 m)     Head Circumference --      Peak Flow --      Pain Score 07/30/19 2004 5     Pain Loc --  Pain Edu? --      Excl. in GC? --    No data found.  Updated Vital Signs BP (!) 136/91 (BP Location: Left Arm)   Pulse 90   Temp 98.7 F (37.1 C) (Oral)   Resp 16   Ht 5\' 9"  (1.753 m)   Wt 260 lb (117.9 kg)   LMP 07/29/2019   SpO2 100%   BMI 38.40 kg/m   Visual Acuity Right Eye Distance:   Left Eye Distance:   Bilateral Distance:    Right Eye Near:   Left Eye Near:    Bilateral Near:     Physical Exam Constitutional:      General: She is not in acute distress.    Appearance: She is well-developed. She is not ill-appearing, toxic-appearing or diaphoretic.  Eyes:     General: No visual field deficit.    Extraocular Movements: Extraocular movements intact.     Right eye: Normal extraocular motion and no nystagmus.     Left eye: Normal extraocular motion and no nystagmus.     Pupils:  Pupils are equal, round, and reactive to light. Pupils are equal.     Right eye: Pupil is round and reactive.     Left eye: Pupil is round and reactive.  Neck:     Meningeal: Brudzinski's sign and Kernig's sign absent.  Cardiovascular:     Rate and Rhythm: Normal rate and regular rhythm.     Heart sounds: No murmur heard.  No friction rub. No gallop.   Pulmonary:     Effort: Pulmonary effort is normal. No respiratory distress.     Breath sounds: Normal breath sounds. No stridor. No wheezing, rhonchi or rales.  Abdominal:     General: Bowel sounds are normal.     Palpations: Abdomen is soft.  Musculoskeletal:     Cervical back: Normal range of motion and neck supple. No rigidity.  Lymphadenopathy:     Cervical: No cervical adenopathy.  Neurological:     Mental Status: She is alert.     Cranial Nerves: No cranial nerve deficit, dysarthria or facial asymmetry.     Sensory: No sensory deficit.     Motor: No weakness.     Coordination: Romberg sign negative. Coordination normal.     Gait: Gait normal.     Deep Tendon Reflexes: Reflexes normal.  Psychiatric:        Behavior: Behavior normal.    UC Treatments / Results  Labs (all labs ordered are listed, but only abnormal results are displayed) Labs Reviewed - No data to display  EKG   Radiology No results found.  Procedures Procedures (including critical care time)  Medications Ordered in UC Medications - No data to display  Initial Impression / Assessment and Plan / UC Course  I have reviewed the triage vital signs and the nursing notes.  Pertinent labs & imaging results that were available during my care of the patient were reviewed by me and considered in my medical decision making (see chart for details).     1.  Treatment options were discussed today with the patient. 2.  The patient states that her headache is much improved at today's visit.  She does not have any red flag symptoms. 3.  Red flag symptoms were  reviewed, she was instructed to proceed to the ER if she develops any of these. 4.  The patient was instructed to follow-up on as-needed basis, continue to take ibuprofen for discomfort. Final Clinical Impressions(s) /  UC Diagnoses   Final diagnoses:  Tension headache     Discharge Instructions     -Continue taking Ibuprofen/Tylenol as needed. -Proceed to ER if symptoms significantly worsen.    ED Prescriptions    None     PDMP not reviewed this encounter.   [place], [place] 07/30/19 2028

## 2019-07-30 NOTE — Discharge Instructions (Addendum)
-  Continue taking Ibuprofen/Tylenol as needed. -Proceed to ER if symptoms significantly worsen.

## 2019-07-30 NOTE — ED Triage Notes (Signed)
Patient states that she has a headache that started this morning. Reports that she was here last night for a q-tip end stuck in her ear. States that her job told her to come get seen since she was out today due to the headache.

## 2020-02-02 ENCOUNTER — Ambulatory Visit
Admission: EM | Admit: 2020-02-02 | Discharge: 2020-02-02 | Disposition: A | Discharge status: Home / Self Care | Payer: Medicaid Other | Attending: Emergency Medicine | Admitting: Emergency Medicine

## 2020-02-02 ENCOUNTER — Other Ambulatory Visit: Payer: Self-pay

## 2020-02-02 DIAGNOSIS — L02411 Cutaneous abscess of right axilla: Secondary | ICD-10-CM

## 2020-02-02 MED ORDER — HYDROCODONE-ACETAMINOPHEN 5-325 MG PO TABS: 2.0000 | ORAL_TABLET | ORAL | 0 refills | Status: DC | PRN

## 2020-02-02 MED ORDER — DOXYCYCLINE HYCLATE 100 MG PO CAPS: 100.0000 mg | ORAL_CAPSULE | Freq: Two times a day (BID) | ORAL | 0 refills | Status: DC

## 2020-02-02 NOTE — ED Provider Notes (Signed)
[facility]    CSN: [id number] Arrival date & time: 02/02/20  1939      History   Chief Complaint Chief Complaint  Patient presents with  . Abscess    HPI Deanna Barnes is a 25 y.o. female.   HPI   25 year old female here for evaluation of a boil in her right armpit.  Patient reports that she has been experiencing pain and swelling in her right armpit for the last 2 to 3 days.  Patient reports that a year ago she had a similar episode in her left armpit that had to be lanced and packed.  Patient states that it is very painful for her to move her arm in any direction due to the size of the boil.  Patient denies fever or drainage.  Past Medical History:  Diagnosis Date  . Asthma     There are no problems to display for this patient.   Past Surgical History:  Procedure Laterality Date  . WISDOM TOOTH EXTRACTION      OB History   No obstetric history on file.      Home Medications    Prior to Admission medications   Medication Sig Start Date End Date Taking? Authorizing Provider  doxycycline (VIBRAMYCIN) 100 MG capsule Take 1 capsule (100 mg total) by mouth 2 (two) times daily. 02/02/20  Yes Becky Augusta, NP  HYDROcodone-acetaminophen (NORCO/VICODIN) 5-325 MG tablet Take 2 tablets by mouth every 4 (four) hours as needed. 02/02/20  Yes Becky Augusta, NP    Family History Family History  Problem Relation Age of Onset  . Asthma Mother   . Hypertension Father     Social History Social History   Tobacco Use  . Smoking status: Former Games developer  . Smokeless tobacco: Never Used  Vaping Use  . Vaping Use: Never used  Substance Use Topics  . Alcohol use: No  . Drug use: Not Currently    Types: Marijuana     Allergies   Patient has no known allergies.   Review of Systems Review of Systems  Constitutional: Negative for fever.  Musculoskeletal: Positive for myalgias. Negative for arthralgias and joint swelling.  Skin: Positive for color change  and wound.  Hematological: Negative.   Psychiatric/Behavioral: Negative.      Physical Exam Triage Vital Signs ED Triage Vitals  Enc Vitals Group     BP      Pulse      Resp      Temp      Temp src      SpO2      Weight      Height      Head Circumference      Peak Flow      Pain Score      Pain Loc      Pain Edu?      Excl. in GC?    No data found.  Updated Vital Signs BP (!) 154/92 (BP Location: Left Arm)   Pulse (!) 109   Temp 97.8 F (36.6 C) (Oral)   Resp 19   Ht 5\' 9"  (1.753 m)   Wt 260 lb (117.9 kg)   LMP 01/29/2020   SpO2 100%   BMI 38.40 kg/m   Visual Acuity Right Eye Distance:   Left Eye Distance:   Bilateral Distance:    Right Eye Near:   Left Eye Near:    Bilateral Near:     Physical Exam Vitals and nursing note reviewed.  Constitutional:      General: She is not in acute distress.    Appearance: Normal appearance. She is obese.  HENT:     Head: Normocephalic and atraumatic.  Musculoskeletal:        General: Swelling present.  Skin:    General: Skin is warm and dry.     Capillary Refill: Capillary refill takes less than 2 seconds.     Findings: Erythema and lesion present.  Neurological:     General: No focal deficit present.     Mental Status: She is alert and oriented to person, place, and time.  Psychiatric:        Mood and Affect: Mood normal.        Behavior: Behavior normal.        Thought Content: Thought content normal.        Judgment: Judgment normal.      UC Treatments / Results  Labs (all labs ordered are listed, but only abnormal results are displayed) Labs Reviewed - No data to display  EKG   Radiology No results found.  Procedures Incision and Drainage  Date/Time: 02/02/2020 8:27 PM Performed by: Becky Augusta, NP Authorized by: Becky Augusta, NP   Consent:    Consent obtained:  Verbal   Consent given by:  Patient   Risks, benefits, and alternatives were discussed: yes     Risks discussed:   Bleeding, incomplete drainage and infection   Alternatives discussed:  Alternative treatment Location:    Type:  Abscess   Location:  Upper extremity   Upper extremity location: Right axilla. Pre-procedure details:    Skin preparation:  Chlorhexidine Anesthesia:    Anesthesia method:  Local infiltration   Local anesthetic:  Bupivacaine 0.25% WITH epi (5 mL) Procedure type:    Complexity:  Simple Procedure details:    Incision types:  Stab incision   Incision depth:  Subcutaneous   Drainage:  Purulent   Drainage amount:  Copious   Wound treatment:  Wound left open Post-procedure details:    Procedure completion:  Tolerated with difficulty Comments:     Stab incision was made with a #11 blade in the abscess in the right axilla.  The abscess measured 2 cm x 6 and meters.  Anesthesia was achieved with 5 mL of 0.25% bupivacaine with lidocaine.  Copious amounts of purulent, foul-smelling drainage was expressed from the wound.  Patient tolerated this with great difficulty.  Wound was not packed as patient does not believe she would be able to tolerate packing the wound.   (including critical care time)  Medications Ordered in UC Medications - No data to display  Initial Impression / Assessment and Plan / UC Course  I have reviewed the triage vital signs and the nursing notes.  Pertinent labs & imaging results that were available during my care of the patient were reviewed by me and considered in my medical decision making (see chart for details).   Evaluation of an abscess in her right axilla that has been there for the past 2 to 3 days.  The abscess measures 6 cm x 3 cm.  The area is erythematous, swollen, and tender to touch.  The area is also fluctuant without induration.  Will perform I&D and packed the wound.  We will send the patient home on doxycycline and give Norco for pain.   Final Clinical Impressions(s) / UC Diagnoses   Final diagnoses:  Abscess of axilla, right      Discharge Instructions  Take the doxycycline twice daily for 10 days.  Continue to apply warm compresses to the abscess to help facilitate further drainage.  Use over-the-counter ibuprofen and Tylenol as needed for mild to moderate pain.  You can use the Norco as needed for severe pain.      ED Prescriptions    Medication Sig Dispense Auth. Provider   doxycycline (VIBRAMYCIN) 100 MG capsule Take 1 capsule (100 mg total) by mouth 2 (two) times daily. 20 capsule Becky Augusta, NP   HYDROcodone-acetaminophen (NORCO/VICODIN) 5-325 MG tablet Take 2 tablets by mouth every 4 (four) hours as needed. 6 tablet Becky Augusta, NP     I have reviewed the PDMP during this encounter.   Becky Augusta, NP 02/02/20 2031

## 2020-02-02 NOTE — ED Triage Notes (Signed)
Pt noticed abscess to right axilla starting a few days ago.

## 2020-02-02 NOTE — Discharge Instructions (Addendum)
Take the doxycycline twice daily for 10 days.  Continue to apply warm compresses to the abscess to help facilitate further drainage.  Use over-the-counter ibuprofen and Tylenol as needed for mild to moderate pain.  You can use the Norco as needed for severe pain.

## 2020-02-03 ENCOUNTER — Telehealth: Payer: Self-pay | Admitting: Family Medicine

## 2020-02-03 MED ORDER — HYDROCODONE-ACETAMINOPHEN 5-325 MG PO TABS: 1.0000 | ORAL_TABLET | Freq: Four times a day (QID) | ORAL | 0 refills | Status: DC | PRN

## 2020-02-03 MED ORDER — DOXYCYCLINE HYCLATE 100 MG PO CAPS: 100.0000 mg | ORAL_CAPSULE | Freq: Two times a day (BID) | ORAL | 0 refills | Status: DC

## 2020-02-03 NOTE — Telephone Encounter (Signed)
Patient wanted Rx's sent to another pharmacy. Canceled by nursing staff. Rx's sent to [facility].  Everlene Other DO [facility]

## 2020-04-21 ENCOUNTER — Other Ambulatory Visit: Payer: Self-pay

## 2020-04-21 ENCOUNTER — Ambulatory Visit
Admission: EM | Admit: 2020-04-21 | Discharge: 2020-04-21 | Disposition: A | Discharge status: Home / Self Care | Payer: 59 | Attending: Emergency Medicine | Admitting: Emergency Medicine

## 2020-04-21 DIAGNOSIS — N61 Mastitis without abscess: Secondary | ICD-10-CM

## 2020-04-21 MED ORDER — CEPHALEXIN 500 MG PO CAPS: 500.0000 mg | ORAL_CAPSULE | Freq: Four times a day (QID) | ORAL | 0 refills | Status: DC

## 2020-04-21 MED ORDER — HYDROCODONE-ACETAMINOPHEN 5-325 MG PO TABS: 1.0000 | ORAL_TABLET | Freq: Four times a day (QID) | ORAL | 0 refills | Status: DC | PRN

## 2020-04-21 NOTE — Discharge Instructions (Addendum)
Take the Keflex 4 times a day with food for treatment of the cellulitis your left breast.  Avoid wearing a bra as much as possible and apply a warm compress to the area to see if you can get any fluid to come to the surface and drain.  Use over-the-counter Tylenol and Motrin as needed for mild to moderate pain and take the Norco as needed for severe pain.  If your symptoms do not improve you will need to see a breast surgeon for possible drainage.

## 2020-04-21 NOTE — ED Triage Notes (Signed)
Pt c/o pain in her left breast, states area is swollen. She is concerned about an abscess. Pt states area has been present for about a week.

## 2020-04-21 NOTE — ED Provider Notes (Signed)
[facility]    CSN: [id number] Arrival date & time: 04/21/20  1932      History   Chief Complaint Chief Complaint  Patient presents with  . Abscess    HPI Deanna Barnes is a 25 y.o. female.   HPI   25 year old female here for evaluation of left breast pain.  Patient reports that she has been experiencing some pain on the underside of her left breast where her bra rests for the past week.  Patient states that she just ended her menstrual cycle yesterday and she thought that it might be related to hormones but the pain has not improved.  Patient states that she has not had a fever and there is no pain drainage from the wound.  Patient does not have a history of breast cysts or abscesses on her breasts.  Patient has had abscesses in both axilla before though.  Because it is on the underside of her breast she cannot see it very well and she came in tonight to have it evaluated.  Past Medical History:  Diagnosis Date  . Asthma     There are no problems to display for this patient.   Past Surgical History:  Procedure Laterality Date  . WISDOM TOOTH EXTRACTION      OB History   No obstetric history on file.      Home Medications    Prior to Admission medications   Medication Sig Start Date End Date Taking? Authorizing Provider  cephALEXin (KEFLEX) 500 MG capsule Take 1 capsule (500 mg total) by mouth 4 (four) times daily. 04/21/20  Yes Becky Augusta, NP  HYDROcodone-acetaminophen (NORCO/VICODIN) 5-325 MG tablet Take 1 tablet by mouth every 6 (six) hours as needed. 04/21/20  Yes Becky Augusta, NP    Family History Family History  Problem Relation Age of Onset  . Asthma Mother   . Hypertension Father     Social History Social History   Tobacco Use  . Smoking status: Former Games developer  . Smokeless tobacco: Never Used  Vaping Use  . Vaping Use: Never used  Substance Use Topics  . Alcohol use: No  . Drug use: Not Currently    Types: Marijuana      Allergies   Patient has no known allergies.   Review of Systems Review of Systems  Constitutional: Negative for activity change, appetite change and fever.  Cardiovascular: Negative for chest pain.  Skin: Positive for color change. Negative for wound.  Hematological: Negative.   Psychiatric/Behavioral: Negative.      Physical Exam Triage Vital Signs ED Triage Vitals [04/21/20 1942]  Enc Vitals Group     BP      Pulse      Resp      Temp      Temp src      SpO2      Weight 250 lb (113.4 kg)     Height 5\' 9"  (1.753 m)     Head Circumference      Peak Flow      Pain Score 10     Pain Loc      Pain Edu?      Excl. in GC?    No data found.  Updated Vital Signs BP (!) 144/85 (BP Location: Left Arm)   Pulse (!) 101   Temp 98.9 F (37.2 C) (Oral)   Resp 18   Ht 5\' 9"  (1.753 m)   Wt 250 lb (113.4 kg)   LMP 04/16/2020  SpO2 100%   BMI 36.92 kg/m   Visual Acuity Right Eye Distance:   Left Eye Distance:   Bilateral Distance:    Right Eye Near:   Left Eye Near:    Bilateral Near:     Physical Exam Vitals and nursing note reviewed. Exam conducted with a chaperone present Zenon Mayo, CMA).  Constitutional:      General: She is not in acute distress.    Appearance: Normal appearance. She is obese. She is not ill-appearing.  HENT:     Head: Normocephalic and atraumatic.  Cardiovascular:     Rate and Rhythm: Normal rate and regular rhythm.     Pulses: Normal pulses.     Heart sounds: Normal heart sounds. No murmur heard. No gallop.   Pulmonary:     Effort: Pulmonary effort is normal.     Breath sounds: Normal breath sounds. No wheezing, rhonchi or rales.  Musculoskeletal:        General: Tenderness present. No swelling or signs of injury.  Skin:    General: Skin is warm and dry.     Capillary Refill: Capillary refill takes less than 2 seconds.     Findings: Erythema present. No lesion.  Neurological:     General: No focal deficit present.     Mental  Status: She is alert and oriented to person, place, and time.  Psychiatric:        Mood and Affect: Mood normal.        Behavior: Behavior normal.        Thought Content: Thought content normal.        Judgment: Judgment normal.      UC Treatments / Results  Labs (all labs ordered are listed, but only abnormal results are displayed) Labs Reviewed - No data to display  EKG   Radiology No results found.  Procedures Procedures (including critical care time)  Medications Ordered in UC Medications - No data to display  Initial Impression / Assessment and Plan / UC Course  I have reviewed the triage vital signs and the nursing notes.  Pertinent labs & imaging results that were available during my care of the patient were reviewed by me and considered in my medical decision making (see chart for details).   Patient is a very pleasant 25 year old female here for evaluation of pain in the inferior aspect of her left breast where her breast and her chest wall meet.  Patient reports that she has had pain in that area for the past week.  Patient reports that she was on her menstrual cycle and the pain developed and she thought it might be hormonal related but her cycle ended yesterday and she is still continue to have the pain.  Patient remained looked at it and reported to the patient that the area was red.  Physical exam conducted with chaperone present.  Patient is nontoxic in appearance.  Cardiopulmonary exam is unremarkable.  There is a 4 cm x 2 cm area of erythema in the underside of the left breast centrally where it meets the chest wall that is warm to touch.  There is no fluctuance or induration appreciable on exam.  No drainage from the area.  This could possibly be an early developing abscess versus just a localized cellulitis.  Will put patient on Keflex 4 times daily x10 days, have her apply warm compresses to the area to see if anything will drain,   Final Clinical Impressions(s)  / UC Diagnoses  Final diagnoses:  Cellulitis of left breast     Discharge Instructions     Take the Keflex 4 times a day with food for treatment of the cellulitis your left breast.  Avoid wearing a bra as much as possible and apply a warm compress to the area to see if you can get any fluid to come to the surface and drain.  Use over-the-counter Tylenol and Motrin as needed for mild to moderate pain and take the Norco as needed for severe pain.  If your symptoms do not improve you will need to see a breast surgeon for possible drainage.    ED Prescriptions    Medication Sig Dispense Auth. Provider   cephALEXin (KEFLEX) 500 MG capsule Take 1 capsule (500 mg total) by mouth 4 (four) times daily. 40 capsule Becky Augusta, NP   HYDROcodone-acetaminophen (NORCO/VICODIN) 5-325 MG tablet Take 1 tablet by mouth every 6 (six) hours as needed. 10 tablet Becky Augusta, NP     I have reviewed the PDMP during this encounter.   Becky Augusta, NP 04/21/20 2004

## 2020-07-05 ENCOUNTER — Other Ambulatory Visit: Payer: Self-pay

## 2020-07-05 ENCOUNTER — Encounter: Payer: Self-pay | Admitting: Internal Medicine

## 2020-07-05 ENCOUNTER — Ambulatory Visit: Payer: 59 | Admitting: Internal Medicine

## 2020-07-05 VITALS — BP 125/88 | HR 77 | Temp 98.2°F | Ht 66.54 in | Wt 304.6 lb

## 2020-07-05 DIAGNOSIS — L0291 Cutaneous abscess, unspecified: Secondary | ICD-10-CM | POA: Diagnosis not present

## 2020-07-05 MED ORDER — SULFAMETHOXAZOLE-TRIMETHOPRIM 800-160 MG PO TABS: 1.0000 | ORAL_TABLET | Freq: Two times a day (BID) | ORAL | 0 refills | Status: AC

## 2020-07-05 NOTE — Progress Notes (Signed)
BP 125/88   Pulse 77   Temp 98.2 F (36.8 C) (Oral)   Ht 5' 6.54" (1.69 m)   Wt (!) 304 lb 9.6 oz (138.2 kg)   LMP 07/03/2020   SpO2 99%   BMI 48.38 kg/m    Subjective:    Patient ID: Deanna Barnes, female    DOB: [birth year], 25 y.o.   MRN: [id number]  Chief Complaint  Patient presents with  . Rash    In between breasts.  Wants paperwork filled out for work.     HPI: Deanna Barnes is a 25 y.o. female  Rash This is a recurrent (has a boil on her chest wall / breast wasnt drained in the ER.per pt started on right side and now has this on the left of the breast. had this around 1 month ago then the left side last week) problem. Episode onset: left sided now no discharge. Pertinent negatives include no anorexia, congestion, cough, diarrhea, eye pain, facial edema, fatigue, fever, joint pain, nail changes, rhinorrhea, shortness of breath, sore throat or vomiting.   Chief Complaint  Patient presents with  . Rash    In between breasts.  Wants paperwork filled out for work.     Relevant past medical, surgical, family and social history reviewed and updated as indicated. Interim medical history since our last visit reviewed. Allergies and medications reviewed and updated.  Review of Systems  Constitutional:  Negative for activity change, appetite change, chills, diaphoresis, fatigue and fever.  HENT:  Negative for congestion, dental problem, drooling, ear discharge, ear pain, rhinorrhea and sore throat.   Eyes:  Negative for pain and discharge.  Respiratory:  Negative for cough and shortness of breath.   Cardiovascular:  Negative for chest pain and palpitations.  Gastrointestinal:  Negative for abdominal distention, abdominal pain, anorexia, blood in stool, constipation, diarrhea, nausea and vomiting.  Endocrine: Negative for cold intolerance, heat intolerance, polydipsia, polyphagia and polyuria.  Genitourinary:  Negative for difficulty urinating, dyspareunia, dysuria, flank  pain, frequency, genital sores and hematuria.  Musculoskeletal:  Negative for joint pain.  Skin:  Positive for rash. Negative for color change, nail changes and pallor.  Neurological:  Negative for dizziness, seizures, speech difficulty, numbness and headaches.  Hematological:  Negative for adenopathy.  Psychiatric/Behavioral:  The patient is not nervous/anxious and is not hyperactive.    Per HPI unless specifically indicated above     Objective:    BP 125/88   Pulse 77   Temp 98.2 F (36.8 C) (Oral)   Ht 5' 6.54" (1.69 m)   Wt (!) 304 lb 9.6 oz (138.2 kg)   LMP 07/03/2020   SpO2 99%   BMI 48.38 kg/m   Wt Readings from Last 3 Encounters:  07/05/20 (!) 304 lb 9.6 oz (138.2 kg)  04/21/20 250 lb (113.4 kg)  02/02/20 260 lb (117.9 kg)    Physical Exam Vitals and nursing note reviewed.  Constitutional:      General: She is not in acute distress.    Appearance: Normal appearance. She is not ill-appearing or diaphoretic.  HENT:     Head: Normocephalic and atraumatic.     Right Ear: Tympanic membrane and external ear normal. There is no impacted cerumen.     Left Ear: External ear normal.     Nose: No congestion or rhinorrhea.     Mouth/Throat:     Pharynx: No oropharyngeal exudate or posterior oropharyngeal erythema.  Eyes:     Conjunctiva/sclera: Conjunctivae normal.  Pupils: Pupils are equal, round, and reactive to light.  Cardiovascular:     Rate and Rhythm: Normal rate and regular rhythm.     Heart sounds: No murmur heard.   No friction rub. No gallop.  Pulmonary:     Effort: No respiratory distress.     Breath sounds: No stridor. No wheezing or rhonchi.  Chest:     Chest wall: No tenderness.  Abdominal:     General: Abdomen is flat. Bowel sounds are normal. There is no distension.     Palpations: Abdomen is soft. There is no mass.     Tenderness: There is no abdominal tenderness. There is no guarding.  Musculoskeletal:        General: No swelling or deformity.      Cervical back: Normal range of motion and neck supple. No rigidity or tenderness.     Right lower leg: No edema.     Left lower leg: No edema.  Skin:    General: Skin is warm and dry.     Coloration: Skin is not jaundiced.     Findings: No erythema.  Neurological:     Mental Status: She is alert.   Results for orders placed or performed during the hospital encounter of 04/06/17  Wet prep, genital  Result Value Ref Range   Yeast Wet Prep HPF POC NONE SEEN NONE SEEN   Trich, Wet Prep NONE SEEN NONE SEEN   Clue Cells Wet Prep HPF POC NONE SEEN NONE SEEN   WBC, Wet Prep HPF POC MODERATE (A) NONE SEEN   Sperm NONE SEEN   Chlamydia/NGC rt PCR   Specimen: Urine  Result Value Ref Range   Specimen source GC/Chlam URINE, RANDOM    Chlamydia Tr NOT DETECTED NOT DETECTED   N gonorrhoeae NOT DETECTED NOT DETECTED  Pregnancy, urine  Result Value Ref Range   Preg Test, Ur NEGATIVE NEGATIVE  RPR  Result Value Ref Range   RPR Ser Ql Non Reactive Non Reactive  HIV antibody  Result Value Ref Range   HIV Screen 4th Generation wRfx Non Reactive Non Reactive       No current outpatient medications on file.    Assessment & Plan:  Breast abcess recurrent will start pt on bactrim , will refer to gen surg for such. Pt will be checked for A1c, CMP. Pt needs Disablitliy paper work filled, unsure if this is what she needs for intermittent leave from work, to find out from employer.   Problem List Items Addressed This Visit   None    Follow up plan: No follow-ups on file.  Health

## 2020-07-06 LAB — CBC WITH DIFFERENTIAL/PLATELET
Basophils Absolute: 0.1 10*3/uL (ref 0.0–0.2)
Basos: 1 %
EOS (ABSOLUTE): 0.1 10*3/uL (ref 0.0–0.4)
Eos: 1 %
Hematocrit: 41.7 % (ref 34.0–46.6)
Hemoglobin: 13.7 g/dL (ref 11.1–15.9)
Immature Grans (Abs): 0 10*3/uL (ref 0.0–0.1)
Immature Granulocytes: 0 %
Lymphocytes Absolute: 1.8 10*3/uL (ref 0.7–3.1)
Lymphs: 21 %
MCH: 26.5 pg — ABNORMAL LOW (ref 26.6–33.0)
MCHC: 32.9 g/dL (ref 31.5–35.7)
MCV: 81 fL (ref 79–97)
Monocytes Absolute: 0.5 10*3/uL (ref 0.1–0.9)
Monocytes: 6 %
Neutrophils Absolute: 6 10*3/uL (ref 1.4–7.0)
Neutrophils: 71 %
Platelets: 293 10*3/uL (ref 150–450)
RBC: 5.17 x10E6/uL (ref 3.77–5.28)
RDW: 14.3 % (ref 11.7–15.4)
WBC: 8.4 10*3/uL (ref 3.4–10.8)

## 2020-07-06 LAB — COMPREHENSIVE METABOLIC PANEL
ALT: 29 IU/L (ref 0–32)
AST: 24 IU/L (ref 0–40)
Albumin/Globulin Ratio: 1.7 (ref 1.2–2.2)
Albumin: 4.8 g/dL (ref 3.9–5.0)
Alkaline Phosphatase: 66 IU/L (ref 44–121)
BUN/Creatinine Ratio: 11 (ref 9–23)
BUN: 8 mg/dL (ref 6–20)
Bilirubin Total: 0.5 mg/dL (ref 0.0–1.2)
CO2: 20 mmol/L (ref 20–29)
Calcium: 9.3 mg/dL (ref 8.7–10.2)
Chloride: 101 mmol/L (ref 96–106)
Creatinine, Ser: 0.76 mg/dL (ref 0.57–1.00)
Globulin, Total: 2.8 g/dL (ref 1.5–4.5)
Glucose: 86 mg/dL (ref 65–99)
Potassium: 4 mmol/L (ref 3.5–5.2)
Sodium: 138 mmol/L (ref 134–144)
Total Protein: 7.6 g/dL (ref 6.0–8.5)
eGFR: 113 mL/min/{1.73_m2} (ref >59)

## 2020-07-06 LAB — MICROSCOPIC EXAMINATION: WBC, UA: NONE SEEN /hpf (ref 0–5)

## 2020-07-06 LAB — BAYER DCA HB A1C WAIVED: HB A1C (BAYER DCA - WAIVED): 5 % (ref <7.0)

## 2020-07-06 LAB — URINALYSIS, ROUTINE W REFLEX MICROSCOPIC
Bilirubin, UA: NEGATIVE
Glucose, UA: NEGATIVE
Ketones, UA: NEGATIVE
Leukocytes,UA: NEGATIVE
Nitrite, UA: NEGATIVE
Specific Gravity, UA: >1.03 — ABNORMAL HIGH (ref 1.005–1.030)
Urobilinogen, Ur: 0.2 mg/dL (ref 0.2–1.0)
pH, UA: 5.5 (ref 5.0–7.5)

## 2020-07-07 ENCOUNTER — Other Ambulatory Visit: Payer: Self-pay

## 2020-07-07 ENCOUNTER — Encounter: Payer: Self-pay | Admitting: General Surgery

## 2020-07-07 ENCOUNTER — Ambulatory Visit: Payer: 59 | Admitting: General Surgery

## 2020-07-07 VITALS — BP 122/86 | HR 120 | Temp 98.9°F | Ht 69.0 in | Wt 303.2 lb

## 2020-07-07 DIAGNOSIS — N611 Abscess of the breast and nipple: Secondary | ICD-10-CM

## 2020-07-07 NOTE — Patient Instructions (Addendum)
Continue taking your antibiotics until finished. If you cannot get any relief, you may go to the ER.  If you have any concerns or questions, please feel free to call our office. Follow up as needed.

## 2020-07-07 NOTE — Progress Notes (Signed)
Patient ID: Deanna Barnes, female   DOB: 1995/04/18, 25 y.o.   MRN: [id number]  Chief Complaint  Patient presents with   New Patient (Initial Visit)    Left breast abscess    HPI Deanna Barnes is a 25 y.o. female.   Other evaluation of a left breast abscess.  [name] states that it has been present for about a week.  There has been no drainage from the site.  She was placed on Bactrim by her PCP and referred for further evaluation and management.  She states that she had 1 recently on the right breast, but this drained spontaneously.  She is also had bilateral axillary abscesses that she attributed to a sensitivity to the deodorant she was using at the time.  She has never had abscesses in her groin/perineum and does not carry a diagnosis of hidradenitis.  She is not diabetic, nor does she smoke.  She says that the pain at the site is 8 out of 10.  She has not had any nausea or vomiting, no fevers or chills.   Past Medical History:  Diagnosis Date   Asthma     Past Surgical History:  Procedure Laterality Date   WISDOM TOOTH EXTRACTION      Family History  Problem Relation Age of Onset   Asthma Mother    Diabetes Father    Hypertension Father     Social History Social History   Tobacco Use   Smoking status: Former    Pack years: 0.00   Smokeless tobacco: Never  Vaping Use   Vaping Use: Never used  Substance Use Topics   Alcohol use: No   Drug use: Not Currently    Types: Marijuana    Allergies  Allergen Reactions   Brassica Oleracea     Current Outpatient Medications  Medication Sig Dispense Refill   sulfamethoxazole-trimethoprim (BACTRIM DS) 800-160 MG tablet Take 1 tablet by mouth 2 (two) times daily for 7 days. 14 tablet 0   No current facility-administered medications for this visit.    Review of Systems Review of Systems  All other systems reviewed and are negative. Or as discussed in the history of present illness  Blood pressure 122/86, pulse (!)  120, temperature 98.9 F (37.2 C), temperature source Oral, height 5\' 9"  (1.753 m), weight (!) 303 lb 3.2 oz (137.5 kg), last menstrual period 07/03/2020, SpO2 97 %.  Physical Exam Physical Exam Vitals reviewed. Exam conducted with a chaperone present.  Constitutional:      Appearance: She is obese.     Comments: She appears uncomfortable, but is in no acute distress.  HENT:     Head: Normocephalic and atraumatic.     Nose:     Comments: Covered with a mask    Mouth/Throat:     Comments: Covered with a mask Eyes:     General: No scleral icterus.       Right eye: No discharge.        Left eye: No discharge.  Neck:     Comments: No palpable cervical or supraclavicular lymphadenopathy.  The trachea is midline.  No thyromegaly or dominant thyroid masses appreciated.  The gland moves freely with deglutition. Cardiovascular:     Rate and Rhythm: Regular rhythm. Tachycardia present.     Pulses: Normal pulses.  Pulmonary:     Effort: Pulmonary effort is normal.     Breath sounds: Normal breath sounds.  Chest:     Comments: Very large,  heavy, pendulous breasts.  There is tissue thickening in the medial aspect of the right breast where she says she had a previous abscess that spontaneously drained.  Deep in the inframammary fold of the left breast, there is erythema and induration.  It is tender to the touch and there is a palpable area of fluctuance. Abdominal:     General: Bowel sounds are normal.     Palpations: Abdomen is soft.     Comments: Protuberant, consistent with her level of obesity.  Genitourinary:    Comments: Deferred Musculoskeletal:        General: No deformity.     Right lower leg: No edema.     Left lower leg: No edema.  Skin:    Comments: She has what appears to be folliculitis covering much of her upper body.  The skin in the bilateral axillary shows signs of prior incision and drainage, but no characteristic lesions suggestive of hidradenitis suppurativa.   Neurological:     General: No focal deficit present.     Mental Status: She is alert and oriented to person, place, and time.  Psychiatric:        Mood and Affect: Mood normal.        Behavior: Behavior normal.    Data Reviewed Review of the electronic medical record shows that she was actually seen in March of this year at the urgent care for left breast cellulitis and was given Keflex.  She also had an urgent care visit in January for a right axillary abscess.  Incision and drainage was performed at that time.  According to the procedure note, it states that the patient tolerated this with great difficulty and the wound was not packed as the patient indicated she did not think she would tolerate packing the wound.  She was prescribed doxycycline at the time.  I also reviewed the office visit note from her primary care provider from a visit dated July 05, 2020.  Bactrim was initiated at this visit.  I was unable to find any culture data in the electronic medical record.  Assessment This is a 25 year old woman with what appears to be a left breast abscess.  I did put the ultrasound on the area of concern and appreciated a fluid collection beneath the skin surface.  I offered her incision and drainage.  Consent was obtained.  Plan I attempted to perform the incision and drainage in clinic today, but after injecting the local anesthetic, the patient became quite panicky, hyperventilating, crying, and refusing to have the procedure done.  I offered that I did think she would feel better if we could get through the procedure, but she repeatedly indicated that she would not be able to tolerate it.  Unfortunately, I am unable to provide sedation in my office.  She should continue the antibiotic, but if she fails to have improvement, she may need to go to the emergency department where sedation could be administered or even taken to the OR by the surgeon on-call.  Based upon her physical exam  findings, she does not appear to have hidradenitis suppurativa, but she certainly is at risk for this given her morbid obesity, and multiple recurrent abscesses in typical locations commonly seen with hidradenitis.  I see that her PCP has ordered hemoglobin A1c, which I think is appropriate.  I will see her on an as-needed basis.    Duanne Guess 07/07/2020, 11:16 AM

## 2020-07-14 ENCOUNTER — Telehealth: Payer: Self-pay | Admitting: *Deleted

## 2020-07-14 NOTE — Telephone Encounter (Signed)
Faxed FMLA to [phone]

## 2020-09-22 ENCOUNTER — Ambulatory Visit
Admission: EM | Admit: 2020-09-22 | Discharge: 2020-09-22 | Disposition: A | Discharge status: Home / Self Care | Payer: 59 | Attending: Family Medicine | Admitting: Family Medicine

## 2020-09-22 ENCOUNTER — Other Ambulatory Visit: Payer: Self-pay

## 2020-09-22 DIAGNOSIS — N611 Abscess of the breast and nipple: Secondary | ICD-10-CM | POA: Diagnosis not present

## 2020-09-22 NOTE — ED Provider Notes (Signed)
[facility]    CSN: [id number] Arrival date & time: 09/22/20  0955      History   Chief Complaint Chief Complaint  Patient presents with   Abscess    HPI 25 year old female presents with the above complaint.  Patient reports that she noticed pain on the underside of her left breast 3 to 4 days ago.  She states that she has been treating herself with over-the-counter analgesics.  She states that this has now worsened.  She is in severe pain.  She rates her pain as 10/10 in severity.  She has been unable to get any sleep.  She has previously seen general surgery for her breast abscess.  Patient is concerned that she has an abscess.  Denies fever.  Denies chills.  Past Medical History:  Diagnosis Date   Asthma     There are no problems to display for this patient.   Past Surgical History:  Procedure Laterality Date   WISDOM TOOTH EXTRACTION      OB History   No obstetric history on file.      Home Medications    Prior to Admission medications   Not on File    Family History Family History  Problem Relation Age of Onset   Asthma Mother    Diabetes Father    Hypertension Father     Social History Social History   Tobacco Use   Smoking status: Former   Smokeless tobacco: Never  Building services engineer Use: Never used  Substance Use Topics   Alcohol use: No   Drug use: Not Currently    Types: Marijuana     Allergies   Brassica oleracea   Review of Systems Review of Systems Per HPI  Physical Exam Triage Vital Signs ED Triage Vitals  Enc Vitals Group     BP 09/22/20 1054 135/74     Pulse Rate 09/22/20 1054 92     Resp 09/22/20 1054 16     Temp 09/22/20 1054 99 F (37.2 C)     Temp Source 09/22/20 1054 Oral     SpO2 09/22/20 1054 100 %     Weight 09/22/20 1052 (!) 301 lb (136.5 kg)     Height 09/22/20 1052 5\' 9"  (1.753 m)     Head Circumference --      Peak Flow --      Pain Score 09/22/20 1052 10     Pain Loc --      Pain  Edu? --      Excl. in GC? --    Updated Vital Signs BP 135/74 (BP Location: Right Arm)   Pulse 92   Temp 99 F (37.2 C) (Oral)   Resp 16   Ht 5\' 9"  (1.753 m)   Wt (!) 136.5 kg   LMP 09/18/2020   SpO2 100%   BMI 44.45 kg/m   Visual Acuity Right Eye Distance:   Left Eye Distance:   Bilateral Distance:    Right Eye Near:   Left Eye Near:    Bilateral Near:     Physical Exam Vitals and nursing note reviewed. Exam conducted with a chaperone present.  Constitutional:      Comments: Patient is alert but she is crying and in severe pain.  HENT:     Head: Normocephalic and atraumatic.  Eyes:     General:        Right eye: No discharge.        Left eye:  No discharge.     Conjunctiva/sclera: Conjunctivae normal.  Pulmonary:     Effort: Pulmonary effort is normal. No respiratory distress.  Psychiatric:        Mood and Affect: Mood normal.        Behavior: Behavior normal.  Breast - (left): Patient had difficulty with even simple lifting up of her left breast.  I briefly visualized a area of erythema and likely fluctuance although her exam was very limited due to the fact that she is in severe pain.  UC Treatments / Results  Labs (all labs ordered are listed, but only abnormal results are displayed) Labs Reviewed - No data to display  EKG   Radiology No results found.  Procedures Procedures (including critical care time)  Medications Ordered in UC Medications - No data to display  Initial Impression / Assessment and Plan / UC Course  I have reviewed the triage vital signs and the nursing notes.  Pertinent labs & imaging results that were available during my care of the patient were reviewed by me and considered in my medical decision making (see chart for details).    25 year old female presents with suspected breast abscess.  I cannot even exam the patient fully due to the severity of her pain, I advised the patient that she needs to go to the hospital so that  her pain can be managed and so that she can have a thorough exam and possible incision and drainage.  May need to go to the operating room.  Patient is going to the hospital via private vehicle.  She declined EMS transport.  Final Clinical Impressions(s) / UC Diagnoses   Final diagnoses:  Breast abscess     Discharge Instructions      Go directly to the ER.  I cannot adequately examine you due to the severity of your pain.  Take care  Dr. Adriana Simas   ED Prescriptions   None    PDMP not reviewed this encounter.   Tommie Sams, [place] 09/22/20 1142

## 2020-09-22 NOTE — Discharge Instructions (Addendum)
Go directly to the ER.  I cannot adequately examine you due to the severity of your pain.  Take care  Dr. Adriana Simas

## 2020-09-22 NOTE — ED Triage Notes (Signed)
Patient states that she has a breast abscess again 3-4 days ago. States that she had one in June. States that the pain is much worse this time.

## 2020-09-22 NOTE — ED Notes (Signed)
Patient is being discharged from the Urgent Care and sent to the Emergency Department via POV . Per Dr. Adriana Simas, patient is in need of higher level of care due to patient being in extreme pain from breast abscess and is Dr. Adriana Simas is unable to examine patient secondary to pain. Patient is aware and verbalizes understanding of plan of care.  Vitals:   09/22/20 1054  BP: 135/74  Pulse: 92  Resp: 16  Temp: 99 F (37.2 C)  SpO2: 100%

## 2020-09-30 ENCOUNTER — Telehealth: Payer: Self-pay | Admitting: General Surgery

## 2020-09-30 NOTE — Telephone Encounter (Signed)
Outbound call made to the pt @ the # indicated w/in her chart; however, the message received is "your call is unable to be completed as dialed".  Her request to schedule an ED f/u appt w/Dr. Lady Gary will be completed via MyChart.  Thank you

## 2020-10-04 ENCOUNTER — Ambulatory Visit: Payer: 59 | Admitting: General Surgery

## 2020-10-11 ENCOUNTER — Encounter: Payer: Self-pay | Admitting: General Surgery

## 2021-06-01 ENCOUNTER — Encounter: Payer: Self-pay | Admitting: Family Medicine

## 2021-06-01 ENCOUNTER — Ambulatory Visit: Payer: Self-pay | Admitting: Family Medicine

## 2021-06-01 DIAGNOSIS — Z113 Encounter for screening for infections with a predominantly sexual mode of transmission: Secondary | ICD-10-CM

## 2021-06-01 DIAGNOSIS — B9689 Other specified bacterial agents as the cause of diseases classified elsewhere: Secondary | ICD-10-CM

## 2021-06-01 DIAGNOSIS — N760 Acute vaginitis: Secondary | ICD-10-CM

## 2021-06-01 DIAGNOSIS — Z3200 Encounter for pregnancy test, result unknown: Secondary | ICD-10-CM

## 2021-06-01 LAB — WET PREP FOR TRICH, YEAST, CLUE
Clue Cell Exam: POSITIVE — AB
Trichomonas Exam: NEGATIVE
Yeast Exam: NEGATIVE

## 2021-06-01 LAB — PREGNANCY, URINE: Preg Test, Ur: NEGATIVE

## 2021-06-01 LAB — HM HIV SCREENING LAB: HM HIV Screening: NEGATIVE

## 2021-06-01 MED ORDER — METRONIDAZOLE 500 MG PO TABS: 500.0000 mg | ORAL_TABLET | Freq: Two times a day (BID) | ORAL | 0 refills | Status: AC

## 2021-06-01 NOTE — Progress Notes (Signed)
? ?[place] problem visit  ?[place] Department ? ?Subjective:  ?Deanna Barnes is a 26 y.o. being seen today for  ? ?Chief Complaint  ?Patient presents with  ? SEXUALLY TRANSMITTED DISEASE  ?  STI screening. Denies sx but recently had a new partner  ? ? ?Pt in clinic for screening for STI and PT.  Pt. denies s/sx of STIs  ? ? ? ?Does the patient have a current or past history of drug use? Yes  ? No components found for: HCV] ? ? ?Health Maintenance Due  ?Topic Date Due  ? COVID-19 Vaccine (1) Never done  ? HPV VACCINES (2 - 2-dose series) 01/02/2008  ? Hepatitis C Screening  Never done  ? TETANUS/TDAP  07/02/2017  ? PAP-Cervical Cytology Screening  Never done  ? PAP SMEAR-Modifier  Never done  ? CHLAMYDIA SCREENING  04/07/2018  ? ? ?ROS ? ?The following portions of the patient's history were reviewed and updated as appropriate: allergies, current medications, past family history, past medical history, past social history, past surgical history and problem list. Problem list updated. ? ? ?See flowsheet for other program required questions. ? ?Objective:  ?There were no vitals filed for this visit. ? ?Physical Exam ?Vitals and nursing note reviewed.  ?Constitutional:   ?   Appearance: Normal appearance.  ?HENT:  ?   Head: Normocephalic and atraumatic.  ?   Mouth/Throat:  ?   Mouth: Mucous membranes are moist.  ?   Pharynx: Oropharynx is clear. No oropharyngeal exudate or posterior oropharyngeal erythema.  ?Pulmonary:  ?   Effort: Pulmonary effort is normal.  ?Abdominal:  ?   General: Abdomen is flat.  ?   Palpations: There is no mass.  ?   Tenderness: There is no abdominal tenderness. There is no rebound.  ?Genitourinary: ?   Exam position: Lithotomy position.  ?   Pubic Area: No rash or pubic lice.   ?   Labia:     ?   Right: No rash or lesion.     ?   Left: No rash or lesion.   ?   Vagina: No erythema, bleeding or lesions.  ?   Cervix: No cervical motion tenderness, discharge,  friability, lesion or erythema.  ?   Uterus: Normal.   ?   Adnexa: Right adnexa normal and left adnexa normal.  ?   Comments: Pt self collected  ?Lymphadenopathy:  ?   Head:  ?   Right side of head: No preauricular or posterior auricular adenopathy.  ?   Left side of head: No preauricular or posterior auricular adenopathy.  ?   Cervical: No cervical adenopathy.  ?   Upper Body:  ?   Right upper body: No supraclavicular or axillary adenopathy.  ?   Left upper body: No supraclavicular or axillary adenopathy.  ?   Lower Body: No right inguinal adenopathy. No left inguinal adenopathy.  ?Skin: ?   General: Skin is warm and dry.  ?   Findings: No rash.  ?Neurological:  ?   Mental Status: She is alert and oriented to person, place, and time.  ?Psychiatric:     ?   Mood and Affect: Mood normal.     ?   Behavior: Behavior normal.  ? ? ? ? ?Assessment and Plan:  ?Deanna Barnes is a 26 y.o. female presenting to the [facility] Department for a Women's Health problem visit ? ?1. Screening examination for venereal disease ?  Patient accepted all screenings including wet prep, vaginal CT/GC and bloodwork for HIV/RPR.  ?Patient meets criteria for HepB screening? No. Ordered? No -   ?Patient meets criteria for HepC screening? No. Ordered?  ? ?Wet prep results + clue, odor present    ?Treatment needed  ?Discussed time line for [facility] results and that patient will be called with positive results and encouraged patient to call if she had not heard in 2 weeks.  ?Counseled to return or seek care for continued or worsening symptoms ?Recommended condom use with all sex ? ?Patient is currently using  no BCM  to prevent pregnancy.  ?- Chlamydia/Gonorrhea [place] [facility] ?- HIV [place] [facility] ?- Syphilis Serology, [place] [facility] ?- WET PREP FOR [place], YEAST, CLUE ? ?2. Encounter for pregnancy test, result unknown ?Pt reports taking home Pt that had a faint line and that last period was spotting with light period (5/5).  Previous period  was 3/18-3/20.   ? ?Last sex was unprotected on 03/08/21.  ? ?Pt result was neg.  ?- Pregnancy, urine ? ?3. BV (bacterial vaginosis) ?Pt treated for BV  ?- metroNIDAZOLE (FLAGYL) 500 MG tablet; Take 1 tablet (500 mg total) by mouth 2 (two) times daily for 7 days.  Dispense: 14 tablet; Refill: 0 ? ? ? ?No follow-ups on file. ? ?No future appointments. ? ?Junious Dresser, FNP ?

## 2021-06-01 NOTE — Progress Notes (Signed)
WET Prep reveals BV and treated per provider order with Metro. UPT negative.  Lethea Killings RN ?

## 2021-06-29 NOTE — Addendum Note (Signed)
Addended by: Heywood Bene on: 06/29/2021 03:16 PM   Modules accepted: Orders

## 2022-03-31 ENCOUNTER — Telehealth: Payer: Self-pay

## 2022-03-31 ENCOUNTER — Ambulatory Visit
Admission: EM | Admit: 2022-03-31 | Discharge: 2022-03-31 | Disposition: A | Discharge status: Home / Self Care | Payer: Medicaid Other | Attending: Emergency Medicine | Admitting: Emergency Medicine

## 2022-03-31 ENCOUNTER — Encounter: Payer: Self-pay | Admitting: Emergency Medicine

## 2022-03-31 DIAGNOSIS — R051 Acute cough: Secondary | ICD-10-CM

## 2022-03-31 DIAGNOSIS — H66002 Acute suppurative otitis media without spontaneous rupture of ear drum, left ear: Secondary | ICD-10-CM

## 2022-03-31 DIAGNOSIS — J069 Acute upper respiratory infection, unspecified: Secondary | ICD-10-CM

## 2022-03-31 DIAGNOSIS — J101 Influenza due to other identified influenza virus with other respiratory manifestations: Secondary | ICD-10-CM | POA: Insufficient documentation

## 2022-03-31 DIAGNOSIS — Z1152 Encounter for screening for COVID-19: Secondary | ICD-10-CM | POA: Diagnosis not present

## 2022-03-31 LAB — RESP PANEL BY RT-PCR (RSV, FLU A&B, COVID)  RVPGX2
Influenza A by PCR: POSITIVE — AB
Influenza B by PCR: NEGATIVE
Resp Syncytial Virus by PCR: NEGATIVE
SARS Coronavirus 2 by RT PCR: NEGATIVE

## 2022-03-31 LAB — GROUP A STREP BY PCR: Group A Strep by PCR: NOT DETECTED

## 2022-03-31 MED ORDER — IPRATROPIUM BROMIDE 0.06 % NA SOLN: 2.0000 | Freq: Four times a day (QID) | NASAL | 12 refills | Status: DC

## 2022-03-31 MED ORDER — BENZONATATE 100 MG PO CAPS: 200.0000 mg | ORAL_CAPSULE | Freq: Three times a day (TID) | ORAL | 0 refills | Status: DC

## 2022-03-31 MED ORDER — AMOXICILLIN-POT CLAVULANATE 875-125 MG PO TABS: 1.0000 | ORAL_TABLET | Freq: Two times a day (BID) | ORAL | 0 refills | Status: DC

## 2022-03-31 MED ORDER — AMOXICILLIN-POT CLAVULANATE 875-125 MG PO TABS: 1.0000 | ORAL_TABLET | Freq: Two times a day (BID) | ORAL | 0 refills | Status: AC

## 2022-03-31 MED ORDER — PROMETHAZINE-DM 6.25-15 MG/5ML PO SYRP: 5.0000 mL | ORAL_SOLUTION | Freq: Four times a day (QID) | ORAL | 0 refills | Status: DC | PRN

## 2022-03-31 NOTE — Discharge Instructions (Signed)
Take the Augmentin twice daily for 10 days with food for treatment of your ear infection.  Take an over-the-counter probiotic 1 hour after each dose of antibiotic to prevent diarrhea.  Use over-the-counter Tylenol and ibuprofen as needed for pain or fever.  Place a hot water bottle, or heating pad, underneath your pillowcase at night to help dilate up your ear and aid in pain relief as well as resolution of the infection.  Use the Atrovent nasal spray, 2 squirts in each nostril every 6 hours, as needed for runny nose and postnasal drip.  Use the Tessalon Perles every 8 hours during the day.  Take them with a small sip of water.  They may give you some numbness to the base of your tongue or a metallic taste in your mouth, this is normal.  Use the Promethazine DM cough syrup at bedtime for cough and congestion.  It will make you drowsy so do not take it during the day.  Return for reevaluation for any new or worsening symptoms.  

## 2022-03-31 NOTE — ED Triage Notes (Signed)
Patient c/o cough, sore throat, chest congestion, headache, bodyaches, and nasal congestion that started on Tuesday. Patient denies fevers.  Patient will need a work note.

## 2022-03-31 NOTE — ED Provider Notes (Signed)
[facility]    CSN: [id number] Arrival date & time: 03/31/22  0854      History   Chief Complaint Chief Complaint  Patient presents with   Cough   Sore Throat    HPI Deanna Barnes is a 27 y.o. female.   HPI  27 year old female here for evaluation of flulike symptoms.  The patient symptoms began 4 days ago and they consist of headache, nasal congestion, sore throat, body aches, and nonproductive cough.  She also reports that she has had some vomiting after eating.  When asked about shortness of breath, because she has a history of asthma, she states that after she has a coughing jags has little bit of shortness of breath but not at baseline.  She has had no fever or wheezing.  Past Medical History:  Diagnosis Date   Asthma     There are no problems to display for this patient.   Past Surgical History:  Procedure Laterality Date   WISDOM TOOTH EXTRACTION      OB History   No obstetric history on file.      Home Medications    Prior to Admission medications   Medication Sig Start Date End Date Taking? Authorizing Provider  amoxicillin-clavulanate (AUGMENTIN) 875-125 MG tablet Take 1 tablet by mouth every 12 (twelve) hours for 10 days. 03/31/22 04/10/22 Yes Margarette Canada, NP  benzonatate (TESSALON) 100 MG capsule Take 2 capsules (200 mg total) by mouth every 8 (eight) hours. 03/31/22  Yes Margarette Canada, NP  ipratropium (ATROVENT) 0.06 % nasal spray Place 2 sprays into both nostrils 4 (four) times daily. 03/31/22  Yes Margarette Canada, NP  promethazine-dextromethorphan (PROMETHAZINE-DM) 6.25-15 MG/5ML syrup Take 5 mLs by mouth 4 (four) times daily as needed. 03/31/22  Yes Margarette Canada, NP    Family History Family History  Problem Relation Age of Onset   Asthma Mother    Diabetes Father    Hypertension Father     Social History Social History   Tobacco Use   Smoking status: Former   Smokeless tobacco: Never  Scientific laboratory technician Use: Never used  Substance  Use Topics   Alcohol use: No   Drug use: Not Currently    Types: Marijuana     Allergies   Brassica oleracea   Review of Systems Review of Systems  Constitutional:  Negative for fever.  HENT:  Positive for congestion, rhinorrhea and sore throat. Negative for ear pain.   Respiratory:  Positive for cough and shortness of breath. Negative for wheezing.   Gastrointestinal:  Positive for nausea and vomiting. Negative for diarrhea.     Physical Exam Triage Vital Signs ED Triage Vitals  Enc Vitals Group     BP --      Pulse --      Resp --      Temp --      Temp src --      SpO2 --      Weight 03/31/22 0953 (!) 330 lb (149.7 kg)     Height 03/31/22 0953 '5\' 9"'$  (1.753 m)     Head Circumference --      Peak Flow --      Pain Score 03/31/22 0952 8     Pain Loc --      Pain Edu? --      Excl. in [place]? --    No data found.  Updated Vital Signs BP (!) 138/96 (BP Location: Right Arm)  Pulse (!) 113   Temp 98.7 F (37.1 C) (Oral)   Resp 15   Ht '5\' 9"'$  (1.753 m)   Wt (!) 330 lb (149.7 kg)   LMP 03/09/2022 (Approximate)   SpO2 95%   BMI 48.73 kg/m   Visual Acuity Right Eye Distance:   Left Eye Distance:   Bilateral Distance:    Right Eye Near:   Left Eye Near:    Bilateral Near:     Physical Exam Vitals and nursing note reviewed.  Constitutional:      Appearance: Normal appearance. She is not ill-appearing.  HENT:     Head: Normocephalic and atraumatic.     Right Ear: Tympanic membrane, ear canal and external ear normal. There is no impacted cerumen.     Left Ear: Ear canal and external ear normal. There is no impacted cerumen.     Ears:     Comments: Left tympanic membrane is erythematous and injected.  Right TM is pearly gray in appearance with normal light reflex.  Both EACs are clear.    Nose: Congestion and rhinorrhea present.     Comments: His mucosa is edematous without significant erythema.  There is scant clear discharge in both nares.    Mouth/Throat:      Mouth: Mucous membranes are moist.     Pharynx: Oropharynx is clear. Posterior oropharyngeal erythema present. No oropharyngeal exudate.     Comments: Tonsillar pillars are unremarkable.  Posterior oropharynx demonstrates some erythema and injection with clear postnasal drip. Cardiovascular:     Rate and Rhythm: Normal rate and regular rhythm.     Pulses: Normal pulses.     Heart sounds: Normal heart sounds. No murmur heard.    No friction rub. No gallop.  Pulmonary:     Effort: Pulmonary effort is normal.     Breath sounds: Normal breath sounds. No wheezing, rhonchi or rales.  Abdominal:     Palpations: Abdomen is soft.     Tenderness: There is no abdominal tenderness. There is no guarding or rebound.  Musculoskeletal:     Cervical back: Normal range of motion and neck supple.  Lymphadenopathy:     Cervical: No cervical adenopathy.  Skin:    General: Skin is warm and dry.     Capillary Refill: Capillary refill takes less than 2 seconds.     Findings: No rash.  Neurological:     General: No focal deficit present.     Mental Status: She is alert and oriented to person, place, and time.      UC Treatments / Results  Labs (all labs ordered are listed, but only abnormal results are displayed) Labs Reviewed  GROUP A STREP BY PCR  RESP PANEL BY RT-PCR (RSV, FLU A&B, COVID)  [id number]    EKG   Radiology No results found.  Procedures Procedures (including critical care time)  Medications Ordered in UC Medications - No data to display  Initial Impression / Assessment and Plan / UC Course  I have reviewed the triage vital signs and the nursing notes.  Pertinent labs & imaging results that were available during my care of the patient were reviewed by me and considered in my medical decision making (see chart for details).   Patient is a nontoxic-appearing 27 year old female presenting for evaluation of 4 days worth of flulike symptoms as outlined in HPI above.  Her exam  does reveal an erythematous and injected tympanic membrane on the left which is consistent with otitis media.  She also has inflammation of her upper respiratory tract indicating an upper respiratory infection.  Cardiopulmonary exam reveals clear lung sounds in all fields.  Respiratory panel and strep PCR were collected at triage.  However, I am going to treat her for otitis media with Augmentin which would also cover strep.  She is outside the therapeutic window for Tamiflu if she comes back flu positive.  She has been afebrile so she is not required to quarantine if she is positive for COVID given the current CDC guidelines.  I will give her a work note to return on Monday.  Additionally I will treat her symptoms with Atrovent nasal spray, Tessalon Perles, and Promethazine DM cough syrup.   Final Clinical Impressions(s) / UC Diagnoses   Final diagnoses:  Non-recurrent acute suppurative otitis media of left ear without spontaneous rupture of tympanic membrane  Upper respiratory tract infection, unspecified type  Acute cough     Discharge Instructions      Take the Augmentin twice daily for 10 days with food for treatment of your ear infection.  Take an over-the-counter probiotic 1 hour after each dose of antibiotic to prevent diarrhea.  Use over-the-counter Tylenol and ibuprofen as needed for pain or fever.  Place a hot water bottle, or heating pad, underneath your pillowcase at night to help dilate up your ear and aid in pain relief as well as resolution of the infection.  Use the Atrovent nasal spray, 2 squirts in each nostril every 6 hours, as needed for runny nose and postnasal drip.  Use the Tessalon Perles every 8 hours during the day.  Take them with a small sip of water.  They may give you some numbness to the base of your tongue or a metallic taste in your mouth, this is normal.  Use the Promethazine DM cough syrup at bedtime for cough and congestion.  It will make you drowsy so  do not take it during the day.  Return for reevaluation for any new or worsening symptoms.      ED Prescriptions     Medication Sig Dispense Auth. Provider   amoxicillin-clavulanate (AUGMENTIN) 875-125 MG tablet Take 1 tablet by mouth every 12 (twelve) hours for 10 days. 20 tablet Margarette Canada, NP   benzonatate (TESSALON) 100 MG capsule Take 2 capsules (200 mg total) by mouth every 8 (eight) hours. 21 capsule Margarette Canada, NP   ipratropium (ATROVENT) 0.06 % nasal spray Place 2 sprays into both nostrils 4 (four) times daily. 15 mL Margarette Canada, NP   promethazine-dextromethorphan (PROMETHAZINE-DM) 6.25-15 MG/5ML syrup Take 5 mLs by mouth 4 (four) times daily as needed. 118 mL Margarette Canada, NP      PDMP not reviewed this encounter.   Margarette Canada, NP 03/31/22 1011

## 2022-03-31 NOTE — Telephone Encounter (Signed)
Received msg from [facility] patient access stating that pt is requesting rx's to be switched to [facility] [place] due to issue with [facility] not being able to fill rx's. S/w Margarette Canada NP & gave verbal to go ahead & resend rx's to [facility] [place].

## 2022-04-20 ENCOUNTER — Ambulatory Visit
Admission: RE | Admit: 2022-04-20 | Discharge: 2022-04-20 | Disposition: A | Discharge status: Home / Self Care | Payer: Medicaid Other | Source: Ambulatory Visit | Attending: Physician Assistant | Admitting: Physician Assistant

## 2022-04-20 VITALS — BP 144/76 | HR 94 | Temp 97.7°F | Resp 15 | Ht 69.0 in | Wt 330.0 lb

## 2022-04-20 DIAGNOSIS — R21 Rash and other nonspecific skin eruption: Secondary | ICD-10-CM | POA: Diagnosis not present

## 2022-04-20 DIAGNOSIS — B369 Superficial mycosis, unspecified: Secondary | ICD-10-CM | POA: Diagnosis not present

## 2022-04-20 LAB — WET PREP, GENITAL
Clue Cells Wet Prep HPF POC: NONE SEEN
Sperm: NONE SEEN
Trich, Wet Prep: NONE SEEN
WBC, Wet Prep HPF POC: <10 — AB (ref <10)
Yeast Wet Prep HPF POC: NONE SEEN

## 2022-04-20 MED ORDER — CLOTRIMAZOLE 1 % EX CREA: TOPICAL_CREAM | CUTANEOUS | 0 refills | Status: AC

## 2022-04-20 MED ORDER — FLUCONAZOLE 150 MG PO TABS: ORAL_TABLET | ORAL | 0 refills | Status: DC

## 2022-04-20 NOTE — Discharge Instructions (Addendum)
-  Rash likely fungal.  I sent a pill to the pharmacy to help with this.  I also sent in cream.  Apply twice daily as directed for the next 7 to 10 days until rash resolves. - If no improvement in rash over the next week, apply a small amount of hydrocortisone cream to the area twice daily.

## 2022-04-20 NOTE — ED Triage Notes (Signed)
Patient states that she used a new soap and developed a rash on her chest and vaginal area for a month.  Patient states that she has been putting cream on her chest.  Patient states that the rash is on the outside of her vagina.  Patient denies vaginal discharge.

## 2022-04-20 NOTE — ED Provider Notes (Signed)
[facility]    CSN: [id number] Arrival date & time: 04/20/22  1450      History   Chief Complaint Chief Complaint  Patient presents with   Vaginal Itching    HPI Deanna Barnes is a 27 y.o. female presenting for hyperpigmented rash of chest and suprapubic region for the past couple of weeks.  Reports the rash itches.  Denies any associated urinary symptoms, vaginal discharge or odor.  She reports that she has been using coconut oil in her chest and that seems to have improved the rash but she has not been applying that to the suprapubic area.  She is not reporting any concern for STIs.  Reports that she used a different soap for the rash started and thinks it could be related to that.  Cannot think of any other possibilities.  Also recently took antibiotics the beginning of the month.  HPI  Past Medical History:  Diagnosis Date   Asthma     There are no problems to display for this patient.   Past Surgical History:  Procedure Laterality Date   WISDOM TOOTH EXTRACTION      OB History   No obstetric history on file.      Home Medications    Prior to Admission medications   Medication Sig Start Date End Date Taking? Authorizing Provider  clotrimazole (LOTRIMIN) 1 % cream Apply to affected area 2 times daily 04/20/22 05/04/22 Yes Danton Clap, PA-C  fluconazole (DIFLUCAN) 150 MG tablet Take 1 tab today and repeat in 72 h 04/20/22  Yes Danton Clap, PA-C    Family History Family History  Problem Relation Age of Onset   Asthma Mother    Diabetes Father    Hypertension Father     Social History Social History   Tobacco Use   Smoking status: Former   Smokeless tobacco: Never  Scientific laboratory technician Use: Never used  Substance Use Topics   Alcohol use: No   Drug use: Not Currently    Types: Marijuana     Allergies   Brassica oleracea   Review of Systems Review of Systems  Constitutional:  Negative for fatigue and fever.   Gastrointestinal:  Negative for abdominal pain.  Genitourinary:  Negative for difficulty urinating, dysuria, frequency, pelvic pain, vaginal discharge and vaginal pain.  Skin:  Positive for rash.     Physical Exam Triage Vital Signs ED Triage Vitals  Enc Vitals Group     BP 04/20/22 1512 (!) 144/76     Pulse Rate 04/20/22 1512 94     Resp 04/20/22 1512 15     Temp 04/20/22 1512 97.7 F (36.5 C)     Temp Source 04/20/22 1512 Oral     SpO2 04/20/22 1512 99 %     Weight 04/20/22 1508 (!) 330 lb 0.5 oz (149.7 kg)     Height 04/20/22 1508 5\' 9"  (1.753 m)     Head Circumference --      Peak Flow --      Pain Score 04/20/22 1508 7     Pain Loc --      Pain Edu? --      Excl. in [place]? --    No data found.  Updated Vital Signs BP (!) 144/76 (BP Location: Right Arm)   Pulse 94   Temp 97.7 F (36.5 C) (Oral)   Resp 15   Ht 5\' 9"  (1.753 m)   Wt (!) 330 lb 0.5  oz (149.7 kg)   LMP 04/01/2022 (Approximate)   SpO2 99%   BMI 48.74 kg/m      Physical Exam Vitals and nursing note reviewed.  Constitutional:      General: She is not in acute distress.    Appearance: Normal appearance. She is not ill-appearing or toxic-appearing.  HENT:     Head: Normocephalic and atraumatic.  Eyes:     General: No scleral icterus.       Right eye: No discharge.        Left eye: No discharge.     Conjunctiva/sclera: Conjunctivae normal.  Cardiovascular:     Rate and Rhythm: Normal rate and regular rhythm.     Heart sounds: Normal heart sounds.  Pulmonary:     Effort: Pulmonary effort is normal. No respiratory distress.     Breath sounds: Normal breath sounds.  Musculoskeletal:     Cervical back: Neck supple.  Skin:    General: Skin is dry.     Findings: Rash (hyperpigmened rash of chest and suprapubic region) present.  Neurological:     General: No focal deficit present.     Mental Status: She is alert. Mental status is at baseline.     Motor: No weakness.     Gait: Gait normal.   Psychiatric:        Mood and Affect: Mood normal.        Behavior: Behavior normal.        Thought Content: Thought content normal.      UC Treatments / Results  Labs (all labs ordered are listed, but only abnormal results are displayed) Labs Reviewed  WET PREP, GENITAL - Abnormal; Notable for the following components:      Result Value   WBC, Wet Prep HPF POC <10 (*)    All other components within normal limits    EKG   Radiology No results found.  Procedures Procedures (including critical care time)  Medications Ordered in UC Medications - No data to display  Initial Impression / Assessment and Plan / UC Course  I have reviewed the triage vital signs and the nursing notes.  Pertinent labs & imaging results that were available during my care of the patient were reviewed by me and considered in my medical decision making (see chart for details).   27 year old female presents for hyperpigmented and pruritic rash on her chest and suprapubic region for the past couple weeks.  No other symptoms.  Patient elects to forego pelvic exam performed vaginal self swab which is negative.  Suspect fungal skin infection.  Will treat with clotrimazole at this time.  Also sent Diflucan.  Advised avoiding scented soaps.  Advised if no improvement in condition over the next week to try applying hydrocortisone cream in case symptoms are related to dermatitis.  Follow-up as needed.   Final Clinical Impressions(s) / UC Diagnoses   Final diagnoses:  Fungal infection of skin  Rash and nonspecific skin eruption     Discharge Instructions      -Rash likely fungal.  I sent a pill to the pharmacy to help with this.  I also sent in cream.  Apply twice daily as directed for the next 7 to 10 days until rash resolves. - If no improvement in rash over the next week, apply a small amount of hydrocortisone cream to the area twice daily.    ED Prescriptions     Medication Sig Dispense Auth.  Provider   fluconazole (DIFLUCAN) 150 MG  tablet Take 1 tab today and repeat in 72 h 2 tablet Laurene Footman B, PA-C   clotrimazole (LOTRIMIN) 1 % cream Apply to affected area 2 times daily 45 g Danton Clap, PA-C      PDMP not reviewed this encounter.   Danton Clap, PA-C 04/20/22 1556

## 2022-07-16 DIAGNOSIS — N611 Abscess of the breast and nipple: Secondary | ICD-10-CM | POA: Insufficient documentation

## 2022-07-22 ENCOUNTER — Ambulatory Visit: Admit: 2022-07-22 | Payer: Medicaid Other

## 2022-08-31 ENCOUNTER — Encounter: Payer: Self-pay | Admitting: Emergency Medicine

## 2022-08-31 ENCOUNTER — Ambulatory Visit
Admission: EM | Admit: 2022-08-31 | Discharge: 2022-08-31 | Disposition: A | Discharge status: Home / Self Care | Payer: Medicaid Other

## 2022-08-31 DIAGNOSIS — B372 Candidiasis of skin and nail: Secondary | ICD-10-CM | POA: Diagnosis not present

## 2022-08-31 DIAGNOSIS — L0390 Cellulitis, unspecified: Secondary | ICD-10-CM

## 2022-08-31 MED ORDER — KETOCONAZOLE 2 % EX CREA: TOPICAL_CREAM | CUTANEOUS | 0 refills | Status: DC

## 2022-08-31 MED ORDER — NYSTATIN 100000 UNIT/GM EX POWD: Freq: Once | CUTANEOUS | 0 refills | Status: AC

## 2022-08-31 MED ORDER — DOXYCYCLINE HYCLATE 100 MG PO CAPS: 100.0000 mg | ORAL_CAPSULE | Freq: Two times a day (BID) | ORAL | 0 refills | Status: DC

## 2022-08-31 MED ORDER — FLUCONAZOLE 150 MG PO TABS: ORAL_TABLET | ORAL | 0 refills | Status: DC

## 2022-08-31 NOTE — ED Triage Notes (Signed)
Patient reports red itchy rash on her lower abdomen that started a week ago.  Patient reports putting some fungal cream on it.

## 2022-08-31 NOTE — ED Provider Notes (Signed)
[facility]    CSN: [id number] Arrival date & time: 08/31/22  1827      History   Chief Complaint Chief Complaint  Patient presents with   Rash    HPI Deanna Barnes is a 27 y.o. female who presents with itchy rash on lower abdomen x 1 week. Has been applying an antifungal cream for one day that she had left over, but is almost out. 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 She noticed yesterday pm, that the abdominal fold is red and irritated and applied a towel under it to sleep more comfortable.     Past Medical History:  Diagnosis Date   Asthma     There are no problems to display for this patient.   Past Surgical History:  Procedure Laterality Date   WISDOM TOOTH EXTRACTION      OB History   No obstetric history on file.      Home Medications    Prior to Admission medications   Medication Sig Start Date End Date Taking? Authorizing Provider  doxycycline (VIBRAMYCIN) 100 MG capsule Take 1 capsule (100 mg total) by mouth 2 (two) times daily. 08/31/22  Yes Rodriguez-Southworth, Nettie Elm, PA-C  ketoconazole (NIZORAL) 2 % cream Apply to rash bid x 7-10 days 08/31/22  Yes Rodriguez-Southworth, Nettie Elm, PA-C  fluconazole (DIFLUCAN) 150 MG tablet Take 1 tab today and repeat in 72 h 08/31/22   Rodriguez-Southworth, Nettie Elm, PA-C  nystatin (MYCOSTATIN/NYSTOP) powder Apply topically once for 1 dose. On moist areas to prevent yeast infection 08/31/22 08/31/22  Rodriguez-Southworth, Nettie Elm, PA-C    Family History Family History  Problem Relation Age of Onset   Asthma Mother    Diabetes Father    Hypertension Father     Social History Social History   Tobacco Use   Smoking status: Former   Smokeless tobacco: Never  Advertising account planner   Vaping status: Never Used  Substance Use Topics   Alcohol use: No   Drug use: Not Currently    Types: Marijuana     Allergies   Brassica oleracea   Review of Systems Review  of Systems As noted in HPI  Physical Exam Triage Vital Signs ED Triage Vitals  Encounter Vitals Group     BP 08/31/22 1855 132/86     Systolic BP Percentile --      Diastolic BP Percentile --      Pulse Rate 08/31/22 1855 88     Resp 08/31/22 1855 14     Temp 08/31/22 1855 98.6 F (37 C)     Temp Source 08/31/22 1855 Oral     SpO2 08/31/22 1855 97 %     Weight 08/31/22 1853 (!) 330 lb 0.5 oz (149.7 kg)     Height 08/31/22 1853 5\' 9"  (1.753 m)     Head Circumference --      Peak Flow --      Pain Score 08/31/22 1853 0     Pain Loc --      Pain Education --      Exclude from Growth Chart --    No data found.  Updated Vital Signs BP 132/86 (BP Location: Left Arm)   Pulse 88   Temp 98.6 F (37 C) (Oral)   Resp 14   Ht 5\' 9"  (1.753 m)   Wt (!) 330 lb 0.5 oz (149.7 kg)   LMP 08/14/2022 (Approximate)   SpO2  97%   BMI 48.74 kg/m   Visual Acuity Right Eye Distance:   Left Eye Distance:   Bilateral Distance:    Right Eye Near:   Left Eye Near:    Bilateral Near:     Physical Exam Vitals and nursing note reviewed.  Constitutional:      General: She is not in acute distress.    Appearance: She is not toxic-appearing.  HENT:     Right Ear: External ear normal.     Left Ear: External ear normal.  Eyes:     General: No scleral icterus.    Conjunctiva/sclera: Conjunctivae normal.  Pulmonary:     Effort: Pulmonary effort is normal.  Skin:    Comments: Abdominal fold with redness and warmth on central region and superficial linear abrasions noted. There is no induration. Area is a little tender.   Neurological:     Mental Status: She is alert and oriented to person, place, and time.     Gait: Gait normal.  Psychiatric:        Mood and Affect: Mood normal.        Behavior: Behavior normal.        Thought Content: Thought content normal.        Judgment: Judgment normal.      UC Treatments / Results  Labs (all labs ordered are listed, but only abnormal  results are displayed) Labs Reviewed - No data to display  EKG   Radiology No results found.  Procedures Procedures (including critical care time)  Medications Ordered in UC Medications - No data to display  Initial Impression / Assessment and Plan / UC Course  I have reviewed the triage vital signs and the nursing notes.  Intertrigo  I placed her on Doxy, Nizoral cream as noted and when healed may use the Nystatin powder for prevention when she goes to work. I refilled the Nystatin powder since she is running low on this as well.    Final Clinical Impressions(s) / UC Diagnoses   Final diagnoses:  Cellulitis, unspecified cellulitis site  Candidal skin infection   Discharge Instructions   None    ED Prescriptions     Medication Sig Dispense Auth. Provider   fluconazole (DIFLUCAN) 150 MG tablet Take 1 tab today and repeat in 72 h 2 tablet Rodriguez-Southworth, Nettie Elm, PA-C   ketoconazole (NIZORAL) 2 % cream Apply to rash bid x 7-10 days 60 g Rodriguez-Southworth, Greidys Deland, PA-C   doxycycline (VIBRAMYCIN) 100 MG capsule Take 1 capsule (100 mg total) by mouth 2 (two) times daily. 14 capsule Rodriguez-Southworth, Maudry Zeidan, PA-C   nystatin (MYCOSTATIN/NYSTOP) powder Apply topically once for 1 dose. On moist areas to prevent yeast infection 60 g Rodriguez-Southworth, Nettie Elm, PA-C      PDMP not reviewed this encounter.   Garey Ham, [place] 08/31/22 1927

## 2023-03-21 ENCOUNTER — Encounter: Payer: Self-pay | Admitting: Emergency Medicine

## 2023-03-21 ENCOUNTER — Ambulatory Visit
Admission: EM | Admit: 2023-03-21 | Discharge: 2023-03-21 | Disposition: A | Discharge status: Home / Self Care | Payer: Medicaid Other | Attending: Emergency Medicine | Admitting: Emergency Medicine

## 2023-03-21 DIAGNOSIS — S50819A Abrasion of unspecified forearm, initial encounter: Secondary | ICD-10-CM | POA: Diagnosis not present

## 2023-03-21 MED ORDER — TETANUS-DIPHTH-ACELL PERTUSSIS 5-2.5-18.5 LF-MCG/0.5 IM SUSY: 0.5000 mL | PREFILLED_SYRINGE | Freq: Once | INTRAMUSCULAR | Status: DC

## 2023-03-21 NOTE — ED Triage Notes (Signed)
 W/C injury 03/14/23. 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

## 2023-03-21 NOTE — Discharge Instructions (Addendum)
 You may go back to work now, but follow-up with health at [employer] [facility] in a week to make sure that everything is okay and to fill out any further necessary paperwork.  Call them in advance to find out when they have providers there, so that there is no other further confusion

## 2023-03-21 NOTE — ED Provider Notes (Signed)
 HPI  SUBJECTIVE:  Deanna Barnes is a 28 y.o. female who presents with several abrasions on her forearms sustained while installing plastic shelving at work on 2/20.  She reports erythema and swelling initially, but this has resolved.  No purulent drainage, fevers, pain.  She has been keeping the abrasions clean with soap and water, Neosporin no alleviating factors.  Symptoms are worse when she wears tight close that rub up against the abrasions.  She has no past medical history.  Estimates that her last tetanus tetanus was 10 years ago.  This is a Teacher, adult education. case.   Past Medical History:  Diagnosis Date   Asthma     Past Surgical History:  Procedure Laterality Date   WISDOM TOOTH EXTRACTION      Family History  Problem Relation Age of Onset   Asthma Mother    Diabetes Father    Hypertension Father     Social History   Tobacco Use   Smoking status: Former   Smokeless tobacco: Never  Advertising account planner   Vaping status: Never Used  Substance Use Topics   Alcohol use: No   Drug use: Not Currently    Types: Marijuana    No current facility-administered medications for this encounter. No current outpatient medications on file.  Allergies  Allergen Reactions   Brassica Oleracea      ROS  As noted in HPI.   Physical Exam  BP (!) 145/86 (BP Location: Left Wrist)   Pulse 75   Temp 98.5 F (36.9 C) (Oral)   Resp 18   LMP 03/04/2023   SpO2 100%   Constitutional: Well developed, well nourished, no acute distress Eyes:  EOMI, conjunctiva normal bilaterally HENT: Normocephalic, atraumatic,mucus membranes moist Respiratory: Normal inspiratory effort Cardiovascular: Normal rate GI: nondistended skin:  4 cm healing abrasion right forearm     2 cm, 3 cm healing abrasion left forearm   Musculoskeletal: no deformities Neurologic: Alert & oriented x 3, no focal neuro deficits Psychiatric: Speech and behavior appropriate   ED Course   Medications - No data to  display  No orders of the defined types were placed in this encounter.   No results found for this or any previous visit (from the past 24 hours). No results found.  ED Clinical Impression  1. Abrasion, forearm w/o infection      ED Assessment/Plan     Updating tetanus.  There is no evidence of infection.  These appear to be healing well.  She is cleared to go back to work from my perspective, but will advise her to follow-up with employee health and wellness in a week to make sure everything is okay, and to have them fill any further paperwork if necessary.  Patient declined Tdap.  Plan as above.  Discussed  MDM, treatment plan, and plan for follow-up with patient.  patient agrees with plan.   Meds ordered this encounter  Medications   DISCONTD: Tdap (BOOSTRIX) injection 0.5 mL      *This clinic note was created using Scientist, clinical (histocompatibility and immunogenetics). Therefore, there may be occasional mistakes despite careful proofreading.  ?    Domenick Gong, MD 03/22/23 [phone]

## 2023-06-28 ENCOUNTER — Ambulatory Visit: Payer: PRIVATE HEALTH INSURANCE | Admitting: Pediatrics

## 2023-06-28 ENCOUNTER — Other Ambulatory Visit: Payer: Self-pay | Admitting: Pediatrics

## 2023-06-28 ENCOUNTER — Telehealth: Payer: Self-pay

## 2023-06-28 VITALS — BP 125/84 | HR 87 | Temp 98.0°F | Ht 69.0 in | Wt 330.4 lb

## 2023-06-28 DIAGNOSIS — H9203 Otalgia, bilateral: Secondary | ICD-10-CM

## 2023-06-28 DIAGNOSIS — Z7689 Persons encountering health services in other specified circumstances: Secondary | ICD-10-CM

## 2023-06-28 DIAGNOSIS — H539 Unspecified visual disturbance: Secondary | ICD-10-CM

## 2023-06-28 DIAGNOSIS — Z1330 Encounter for screening examination for mental health and behavioral disorders, unspecified: Secondary | ICD-10-CM

## 2023-06-28 DIAGNOSIS — F4310 Post-traumatic stress disorder, unspecified: Secondary | ICD-10-CM | POA: Diagnosis not present

## 2023-06-28 DIAGNOSIS — F4321 Adjustment disorder with depressed mood: Secondary | ICD-10-CM

## 2023-06-28 DIAGNOSIS — Z131 Encounter for screening for diabetes mellitus: Secondary | ICD-10-CM

## 2023-06-28 DIAGNOSIS — Z113 Encounter for screening for infections with a predominantly sexual mode of transmission: Secondary | ICD-10-CM

## 2023-06-28 DIAGNOSIS — Z13220 Encounter for screening for lipoid disorders: Secondary | ICD-10-CM

## 2023-06-28 MED ORDER — AMOXICILLIN-POT CLAVULANATE 875-125 MG PO TABS
1.0000 | ORAL_TABLET | Freq: Two times a day (BID) | ORAL | 0 refills | Status: AC
Start: 2023-06-28 — End: 2023-07-05

## 2023-06-28 MED ORDER — HYDROCORTISONE-ACETIC ACID 1-2 % OT SOLN: 4.0000 [drp] | Freq: Four times a day (QID) | OTIC | 0 refills | Status: DC

## 2023-06-28 NOTE — Telephone Encounter (Signed)
 Requested medications are due for refill today.  no  Requested medications are on the active medications list.  yes  Last refill. 06/28/2023 10mL   Future visit scheduled.   yes  Notes to clinic.  Pharmacy comment: Alternative Requested:NEEDS PRIOR AUTH TO COVER MEDCIATION.     Requested Prescriptions  Pending Prescriptions Disp Refills   acetic acid-hydrocortisone (VOSOL-HC) OTIC solution [Pharmacy Med Name: HYDROCORTISONE-ACETIC EAR DROP] 10 mL 0    Sig: Place 4 drops into both ears 4 (four) times daily. For 14 days     Ear, Nose, and Throat:  Ear Preparations Failed - 06/28/2023  5:48 PM      Failed - Valid encounter within last 12 months    Recent Outpatient Visits           Today PTSD (post-traumatic stress disorder)   [facility] Hadassah Letters, MD

## 2023-06-28 NOTE — Patient Instructions (Addendum)
 For your ear: augmentin  (antibiotic) and drops sent  Good to meet you! Welcome to [facility]!  As your primary care doctor, I look forward to working with you to help you reach your health goals.  Please be aware of a couple of logistical items: - If you message me on mychart, it may take me 1-2 business days to get back to you. This is for non-urgent messaging.  - If you require urgent clinical attention, please call the clinic or present to urgent care/emergency room - If you have labs, I typically will send a message about them in 1-2 business days. - I am not here on Mondays, otherwise will be available from Tuesday-Friday during 8a-5pm.
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
## 2023-06-28 NOTE — Telephone Encounter (Signed)
 Please advise?   Copied from CRM [id number]. Topic: Clinical - Medication Prior Auth >> Jun 28, 2023 12:03 PM Lynnie Saucier S wrote: Reason for CRM: Patient is at the pharmacy, and says the acetic acid-hydrocortisone (VOSOL-HC) OTIC solution needs a prior authorization from the provider. Callback number is [phone].

## 2023-06-30 LAB — RPR W/REFLEX TO TREPSURE

## 2023-07-01 ENCOUNTER — Telehealth: Payer: Self-pay

## 2023-07-01 ENCOUNTER — Other Ambulatory Visit (HOSPITAL_COMMUNITY): Payer: Self-pay

## 2023-07-01 ENCOUNTER — Encounter: Payer: Self-pay | Admitting: Pediatrics

## 2023-07-01 ENCOUNTER — Other Ambulatory Visit: Payer: Self-pay | Admitting: Pediatrics

## 2023-07-01 DIAGNOSIS — H6690 Otitis media, unspecified, unspecified ear: Secondary | ICD-10-CM

## 2023-07-01 LAB — CHLAMYDIA/GONOCOCCUS/TRICHOMONAS, NAA
Chlamydia by NAA: NEGATIVE
Gonococcus by NAA: NEGATIVE
Trich vag by NAA: NEGATIVE

## 2023-07-01 MED ORDER — OFLOXACIN 0.3 % OT SOLN
5.0000 [drp] | Freq: Two times a day (BID) | OTIC | 0 refills | Status: DC
Start: 2023-07-01 — End: 2023-07-11

## 2023-07-01 NOTE — Telephone Encounter (Signed)
 Ciprodex, ciprofloxacin-dexamethasone suspension, neomycin-polymyxin-hydrocortisone  solution/suspension, or ofloxacin drops is preferred by the insurance. Please change if clinically appropriate or advise as to why preferred therapies cannot be attempted at this time. Thank you.

## 2023-07-01 NOTE — Telephone Encounter (Signed)
 Pharmacy Patient Advocate Encounter   Received notification from Pt Calls Messages that prior authorization for acetic acid -hydrocortisone  (VOSOL -HC) OTIC solution  is required/requested.   Insurance verification completed.   The patient is insured through [facility] MEDICAID .   Per test claim:  Ciprodex, ciprofloxacin-dexamethasone suspension, neomycin-polymyxin-hydrocortisone  solution/suspension, or ofloxacin drops is preferred by the insurance.  If suggested medication is appropriate, Please send in a new RX and discontinue this one. If not, please advise as to why it's not appropriate so that we may request a Prior Authorization. Please note, some preferred medications may still require a PA.  If the suggested medications have not been trialed and there are no contraindications to their use, the PA will not be submitted, as it will not be approved.

## 2023-07-01 NOTE — Progress Notes (Unsigned)
 Complex Care Management Note Care Guide Note  07/01/2023 Name: Deanna Barnes MRN: [id number] DOB: 27-Feb-1995   Complex Care Management Outreach Attempts: An unsuccessful telephone outreach was attempted today to offer the patient information about available complex care management services.  Follow Up Plan:  Additional outreach attempts will be made to offer the patient complex care management information and services.   Encounter Outcome:  No Answer  Lenton Rail , RMA     [place]  [facility], [facility] Guide  Direct Dial: [phone]  Website: [email or web address]

## 2023-07-01 NOTE — Progress Notes (Signed)
 Sending preferred floxin drops instead of vosol  for otitis media and externa.  Hadassah Letters, MD

## 2023-07-01 NOTE — Telephone Encounter (Signed)
 Attempted to reach patient no answer   Okay if E2C2 inform patient

## 2023-07-01 NOTE — Assessment & Plan Note (Signed)
 Significant anxiety and PTSD symptoms post-trauma. Prefers non-pharmacological management. Ashwagandha used but costly, will try to restart. Declines any pharmacologic treatment at this time. - Refer to a therapist for grief counseling and PTSD management. - Provide a printout of natural remedies for anxiety and depression. - Explore prescription version of ashwagandha.

## 2023-07-01 NOTE — Telephone Encounter (Signed)
 See other phone encounter.

## 2023-07-02 ENCOUNTER — Ambulatory Visit: Payer: Self-pay | Admitting: Pediatrics

## 2023-07-02 LAB — CBC WITH DIFFERENTIAL/PLATELET
Basophils Absolute: 0.1 10*3/uL (ref 0.0–0.2)
Basos: 1 %
EOS (ABSOLUTE): 0.1 10*3/uL (ref 0.0–0.4)
Eos: 1 %
Hematocrit: 45.6 % (ref 34.0–46.6)
Hemoglobin: 15.2 g/dL (ref 11.1–15.9)
Immature Grans (Abs): 0 10*3/uL (ref 0.0–0.1)
Immature Granulocytes: 0 %
Lymphocytes Absolute: 2.1 10*3/uL (ref 0.7–3.1)
Lymphs: 22 %
MCH: 28 pg (ref 26.6–33.0)
MCHC: 33.3 g/dL (ref 31.5–35.7)
MCV: 84 fL (ref 79–97)
Monocytes Absolute: 0.6 10*3/uL (ref 0.1–0.9)
Monocytes: 6 %
Neutrophils Absolute: 6.5 10*3/uL (ref 1.4–7.0)
Neutrophils: 70 %
Platelets: 297 10*3/uL (ref 150–450)
RBC: 5.43 x10E6/uL — ABNORMAL HIGH (ref 3.77–5.28)
RDW: 13.4 % (ref 11.7–15.4)
WBC: 9.3 10*3/uL (ref 3.4–10.8)

## 2023-07-02 LAB — COMPREHENSIVE METABOLIC PANEL WITH GFR
ALT: 36 IU/L — ABNORMAL HIGH (ref 0–32)
AST: 35 IU/L (ref 0–40)
Albumin: 4 g/dL (ref 4.0–5.0)
Alkaline Phosphatase: 52 IU/L (ref 44–121)
BUN/Creatinine Ratio: 15 (ref 9–23)
BUN: 11 mg/dL (ref 6–20)
Bilirubin Total: 0.4 mg/dL (ref 0.0–1.2)
CO2: 18 mmol/L — ABNORMAL LOW (ref 20–29)
Calcium: 8.9 mg/dL (ref 8.7–10.2)
Chloride: 106 mmol/L (ref 96–106)
Creatinine, Ser: 0.75 mg/dL (ref 0.57–1.00)
Globulin, Total: 1.9 g/dL (ref 1.5–4.5)
Glucose: 86 mg/dL (ref 70–99)
Potassium: 4.5 mmol/L (ref 3.5–5.2)
Sodium: 139 mmol/L (ref 134–144)
Total Protein: 5.9 g/dL — ABNORMAL LOW (ref 6.0–8.5)
eGFR: 113 mL/min/{1.73_m2} (ref >59)

## 2023-07-02 LAB — HEMOGLOBIN A1C
Est. average glucose Bld gHb Est-mCnc: 97 mg/dL
Hgb A1c MFr Bld: 5 % (ref 4.8–5.6)

## 2023-07-02 LAB — TREPONEMAL ANTIBODIES, TPPA: Treponemal Antibodies, TPPA: NONREACTIVE

## 2023-07-02 LAB — RPR W/REFLEX TO TREPSURE

## 2023-07-02 LAB — HIV ANTIBODY (ROUTINE TESTING W REFLEX): HIV Screen 4th Generation wRfx: NONREACTIVE

## 2023-07-02 NOTE — Telephone Encounter (Signed)
 Requested medication (s) are due for refill today: na   Requested medication (s) are on the active medication list: yes   Last refill:  07/01/23- 07/11/23 #5 ml 0 refills  Future visit scheduled: no   Notes to clinic:  medication not assigned to a protocol. Pharmacy comment: Please clarify the quantity prescribed for this prescription. Pt. will need 2 bottles 10ml for 10 day course.  ok to change? thanks!   Last OV 06/28/23.      Requested Prescriptions  Pending Prescriptions Disp Refills   ofloxacin (FLOXIN) 0.3 % OTIC solution [Pharmacy Med Name: OFLOXACIN OTIC (EAR) 0.3% SOL] 5 mL 0    Sig: INSTILL 5 DROPS INTO EACH EAR TWICE DAILY FOR 10 DAYS     Off-Protocol Failed - 07/02/2023 11:38 AM      Failed - Medication not assigned to a protocol, review manually.      Failed - Valid encounter within last 12 months    Recent Outpatient Visits           4 days ago PTSD (post-traumatic stress disorder)   [facility] Hadassah Letters, MD

## 2023-07-03 ENCOUNTER — Telehealth: Payer: Self-pay

## 2023-07-03 ENCOUNTER — Other Ambulatory Visit: Payer: Self-pay | Admitting: Pediatrics

## 2023-07-03 DIAGNOSIS — H6690 Otitis media, unspecified, unspecified ear: Secondary | ICD-10-CM

## 2023-07-03 NOTE — Progress Notes (Signed)
 Complex Care Management Note  Care Guide Note 07/03/2023 Name: Deanna Barnes MRN: [id number] DOB: August 25, 1995  Deanna Barnes is a 28 y.o. year old female who sees Juliette Oh, Stephannie Ehlers, MD for primary care. I reached out to Deanna Barnes by phone today to offer complex care management services.  Ms. Zachman was given information about Complex Care Management services today including:   The Complex Care Management services include support from the care team which includes your Nurse Care Manager, Clinical Social Worker, or Pharmacist.  The Complex Care Management team is here to help remove barriers to the health concerns and goals most important to you. Complex Care Management services are voluntary, and the patient may decline or stop services at any time by request to their care team member.   Complex Care Management Consent Status: Patient agreed to services and verbal consent obtained.   Follow up plan:  Telephone appointment with complex care management team member scheduled for:  07/22/2023  Encounter Outcome:  Patient Scheduled  Lenton Rail , RMA     [place]  [facility], [facility] Guide  Direct Dial: [phone]  Website: [email or web address]

## 2023-07-03 NOTE — Telephone Encounter (Signed)
 Copied from CRM [id number]. Topic: Clinical - Prescription Issue >> Jul 03, 2023  1:03 PM Santiya F wrote: Reason for CRM: Patient is calling in because she spoke with her pharmacy and they told her they hadn't heard anything regarding the ear drops. After researching, found the prescription for the ear drops were [facility] to [facility] of [facility], [id number] [place], [place] - [address]

## 2023-07-04 ENCOUNTER — Telehealth: Payer: Self-pay

## 2023-07-04 MED ORDER — OFLOXACIN 0.3 % OT SOLN: 5.0000 [drp] | Freq: Two times a day (BID) | OTIC | 0 refills | Status: AC

## 2023-07-04 NOTE — Telephone Encounter (Signed)
 Copied from CRM [id number]. Topic: Referral - Question >> Jul 04, 2023  2:30 PM Yolanda T wrote: Reason for CRM: Alston Jerry from [facility] called stated patient is not eligible for an exam until May of 2026.

## 2023-07-04 NOTE — Telephone Encounter (Signed)
 Forwarding to PCP for review. Referral was placed for vision changes at last appointment.

## 2023-07-04 NOTE — Progress Notes (Signed)
 Resending otic drops to preferred pharmacy  Hadassah Letters, MD

## 2023-07-17 ENCOUNTER — Ambulatory Visit (INDEPENDENT_AMBULATORY_CARE_PROVIDER_SITE_OTHER): Admitting: Pediatrics

## 2023-07-17 ENCOUNTER — Other Ambulatory Visit (HOSPITAL_COMMUNITY)
Admission: RE | Admit: 2023-07-17 | Discharge: 2023-07-17 | Disposition: A | Discharge status: Home / Self Care | Source: Ambulatory Visit | Attending: Pediatrics | Admitting: Pediatrics

## 2023-07-17 VITALS — BP 133/88 | HR 86 | Temp 97.9°F | Ht 69.0 in | Wt 327.2 lb

## 2023-07-17 DIAGNOSIS — Z124 Encounter for screening for malignant neoplasm of cervix: Secondary | ICD-10-CM

## 2023-07-17 DIAGNOSIS — Z0000 Encounter for general adult medical examination without abnormal findings: Secondary | ICD-10-CM

## 2023-07-17 DIAGNOSIS — L732 Hidradenitis suppurativa: Secondary | ICD-10-CM | POA: Diagnosis not present

## 2023-07-17 DIAGNOSIS — Z1330 Encounter for screening examination for mental health and behavioral disorders, unspecified: Secondary | ICD-10-CM

## 2023-07-17 MED ORDER — BENZOYL PEROXIDE WASH 5 % EX LIQD
Freq: Two times a day (BID) | CUTANEOUS | 12 refills | Status: DC
Start: 2023-07-17 — End: 2023-08-14

## 2023-07-17 MED ORDER — CLINDAMYCIN PHOS (ONCE-DAILY) 1 % EX GEL: 1.0000 [IU] | CUTANEOUS | 2 refills | Status: DC | PRN

## 2023-07-17 NOTE — Assessment & Plan Note (Signed)
 Seen on exam. Plan to start below treatment and send referral.
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
## 2023-07-19 NOTE — Telephone Encounter (Signed)
 [facility] has reached out to patient to continue with scheduling for her.

## 2023-07-22 ENCOUNTER — Other Ambulatory Visit: Payer: PRIVATE HEALTH INSURANCE | Admitting: *Deleted

## 2023-07-22 NOTE — Patient Outreach (Signed)
 Complex Care Management   Visit Note  07/22/2023  Name:  Deanna Barnes MRN: [id number] DOB: [birth year]  Situation: Referral received for Complex Care Management related to Mental/Behavioral Health diagnosis PTSD I obtained verbal consent from Patient.  Visit completed with patient  on the phone  Background:   Past Medical History:  Diagnosis Date   Abscess of right breast 07/16/2022   Allergy 2012   Food allergy-Broccoli   Anemia    Asthma     Assessment: Patient Reported Symptoms:  Cognitive Cognitive Status: Alert and oriented to person, place, and time, Normal speech and language skills, Insightful and able to interpret abstract concepts   Health Maintenance Behaviors: None  Neurological Neurological Review of Symptoms: No symptoms reported    HEENT HEENT Symptoms Reported: Eye dryness HEENT Management Strategies: Medication therapy HEENT Comment: reports having fluid behind her eyes    Cardiovascular Cardiovascular Symptoms Reported: No symptoms reported    Respiratory Respiratory Symptoms Reported: No symptoms reported    Endocrine      Gastrointestinal Gastrointestinal Symptoms Reported: No symptoms reported      Genitourinary Genitourinary Symptoms Reported: No symptoms reported    Integumentary Integumentary Symptoms Reported: Skin changes Additional Integumentary Details: Hidradenitis suppurativa Skin Management Strategies: Routine screening  Musculoskeletal Musculoskelatal Symptoms Reviewed: No symptoms reported        Psychosocial Psychosocial Symptoms Reported: Anxiety - if selected complete GAD Additional Psychological Details: Anxiety  symptoms related to death of boyfriend and circumstances surrounding his death Behavioral Management Strategies: Coping strategies Major Change/Loss/Stressor/Fears (CP): Traumatic event Techniques to Cope with Loss/Stress/Change: Diversional activities Quality of Family Relationships: supportive Do you feel  physically threatened by others?: No      07/22/2023    2:52 PM  Depression screen PHQ 2/9  Decreased Interest 0  Down, Depressed, Hopeless 0  PHQ - 2 Score 0      07/22/2023    2:53 PM 07/17/2023    9:48 AM 06/28/2023   10:36 AM  GAD 7 : Generalized Anxiety Score  Nervous, Anxious, on Edge 1 0 1  Control/stop worrying 1 1 1   Worry too much - different things 1 1 1   Trouble relaxing 1 1 1   Restless 0 0 0  Easily annoyed or irritable 1 0 0  Afraid - awful might happen 1 0 1  Total GAD 7 Score 6 3 5   Anxiety Difficulty Somewhat difficult Somewhat difficult Somewhat difficult      There were no vitals filed for this visit.  Medications Reviewed Today     Reviewed by Ermalinda Lenn CHRISTELLA, LCSW (Social Worker) on 07/22/23 at 2246  Med List Status: <None>   Medication Order Taking? Sig Documenting Provider Last Dose Status Informant  benzoyl peroxide  5 % external liquid [id number]  Apply topically 2 (two) times daily. Herold Hadassah SQUIBB, MD  Active   Clindamycin  Phos, Once-Daily, (CLINDAGEL) 1 % GEL [id number]  Apply 1 Units topically as needed. Apply to active lesions Herold Hadassah SQUIBB, MD  Active             Recommendation:   PCP Follow-up Ongoing mental health follow up to address PTSD sympotms  Follow Up Plan:   Telephone follow-up 07/26/23  Lenn Ermalinda, LCSW [place]  [facility], [facility] Licensed Clinical Social Worker  Direct Dial: [phone]

## 2023-07-22 NOTE — Patient Instructions (Signed)
 Visit Information  [name] was given information about Medicaid Managed Care team care coordination services as a part of their [facility] Community Plan Medicaid benefit. [name] verbally consented to engagement with the [facility] Managed Care team.   If you are experiencing a medical emergency, please call 911 or report to your local emergency department or urgent care.   If you have a non-emergency medical problem during routine business hours, please contact your provider's office and ask to speak with a nurse.   For questions related to your [facility], please call: [phone] or visit the homepage here: [email or web address]  If you would like to schedule transportation through your [facility], please call the following number at least 2 days in advance of your appointment: [phone]   Rides for urgent appointments can also be made after hours by calling Member Services.  Call the Behavioral Health Crisis Line at [phone], at any time, 24 hours a day, 7 days a week. If you are in danger or need immediate medical attention call 911.  If you would like help to quit smoking, call 1-800-QUIT-NOW (951 116 5695) OR Espaol: 1-855-Djelo-Ya (8-144-664-6430) o para ms informacin haga clic aqu or Text READY to 799-599 to register via text  [name] - following are the goals we discussed in your visit today:   Goals Addressed             This Visit's Progress    VBCI Social Work Care Plan       Problems:   Disease Management support and education needs related to PTSD-patient continues to have challenges with dealing with the sudden loss of her significant other and the relationships conflicts that followed  CSW Clinical Goal(s):   Over the next 90 days the Patient will explore community resource options for unmet needs related to the management of PTSD  symptoms.  Interventions:  Mental Health:  Evaluation of current treatment plan related to PTSD Active listening / Reflection utilized Behavioral Activation reviewed Discussed referral options to connect for ongoing therapy:   Emotional Support Provided PHQ2/PHQ9 completed Problem Solving /Task Center strategies reviewed GAD 7 completed  Patient Goals/Self-Care Activities:  Follow with mental health provider of choice once identified for ongoing mental health counseling  Plan:   Telephone follow up appointment with care management team member scheduled for:  08/05/22        Patient verbalizes understanding of instructions and care plan provided today and agrees to view in MyChart. Active MyChart status and patient understanding of how to access instructions and care plan via MyChart confirmed with patient.     Social Worker will follow up on [employer] for ongoing mental health support.  [name], LCSW [place]  [facility], Population Health Licensed Clinical Social Worker  Direct Dial: [phone]     Following is a copy of your plan of care:  There are no care plans that you recently modified to display for this patient.

## 2023-07-23 ENCOUNTER — Ambulatory Visit: Payer: Self-pay | Admitting: Pediatrics

## 2023-07-23 LAB — CYTOLOGY - PAP
Adequacy: ABSENT
Diagnosis: REACTIVE

## 2023-08-05 ENCOUNTER — Other Ambulatory Visit: Payer: PRIVATE HEALTH INSURANCE | Admitting: *Deleted

## 2023-08-05 NOTE — Telephone Encounter (Signed)
 Copied from CRM [id number]. Topic: Clinical - Medical Advice >> Aug 05, 2023  3:00 PM DeAngela L wrote: Reason for CRM: Pt called to ask if Dr Herold got her mychart message and which cleanser she suggest for the patient sent pictures, and also her main concern is with the eye doctor telling her that she has fluid behind her eye and she was not told which eye and would like to ask what is her next steps or does she needs a second opinion and would like a suggestion from Dr Herold

## 2023-08-05 NOTE — Patient Outreach (Unsigned)
 Complex Care Management   Visit Note  08/06/2023  Name:  Deanna Barnes MRN: [id number] DOB: 01-Apr-1995  Situation: Referral received for Complex Care Management related to Mental/Behavioral Health diagnosis PTSD Patient continues with challenges dealing with the loss her boyfriend. I obtained verbal consent from Patient.  Visit completed with patient  on the phone   Background:   Past Medical History:  Diagnosis Date   Abscess of right breast 07/16/2022   Allergy 2012   Food allergy-Broccoli   Anemia    Asthma     Assessment: Patient Reported Symptoms:  Cognitive Cognitive Status: No symptoms reported      Neurological Neurological Review of Symptoms: No symptoms reported    HEENT HEENT Symptoms Reported: Eye dryness HEENT Management Strategies: Medication therapy HEENT Comment: reports having fluid behind eye-patient's provider informed per patient    Cardiovascular Cardiovascular Symptoms Reported: No symptoms reported    Respiratory Respiratory Symptoms Reported: No symptoms reported    Endocrine Endocrine Symptoms Reported: No symptoms reported    Gastrointestinal Gastrointestinal Symptoms Reported: No symptoms reported      Genitourinary Genitourinary Symptoms Reported: No symptoms reported    Integumentary Integumentary Symptoms Reported: Skin changes Additional Integumentary Details: Hidradenitis suppurativa Skin Management Strategies: Routine screening  Musculoskeletal Musculoskelatal Symptoms Reviewed: No symptoms reported        Psychosocial Additional Psychological Details: Anxiety continues related to death of boyfriend and circumstances surrounding his death Behavioral Management Strategies: Coping strategies Major Change/Loss/Stressor/Fears (CP): Traumatic event Behaviors When Feeling Stressed/Fearful: has friend that she talks to and prays with, gaming, dancing Techniques to [place] with Loss/Stress/Change: Diversional activities, Spiritual  practice(s), Withdraw Quality of Family Relationships: supportive Do you feel physically threatened by others?: No      07/22/2023    2:52 PM  Depression screen PHQ 2/9  Decreased Interest 0  Down, Depressed, Hopeless 0  PHQ - 2 Score 0    There were no vitals filed for this visit.  Medications Reviewed Today   Medications were not reviewed in this encounter     Recommendation:   PCP Follow-up CSW to continue to research list of in-network mental health providers for ongoing mental health support   Follow Up Plan:   08/29/23  Georgia Baria, LCSW [place]  [facility], [facility] Licensed Clinical Social Worker  Direct Dial: [phone]

## 2023-08-06 NOTE — Patient Instructions (Signed)
 Visit Information  Thank you for taking time to visit with me today. Please don't hesitate to contact me if I can be of assistance to you before our next scheduled appointment.  Your next care management appointment is by telephone on 08/29/23 at 1pm  Telephone follow-up 08/29/23  Please call the care guide team at [phone] if you need to cancel, schedule, or reschedule an appointment.   Please call the Suicide and Crisis Lifeline: 988 if you are experiencing a Mental Health or Behavioral Health Crisis or need someone to talk to. Coleson Kant, LCSW [place]  [facility], [facility] Licensed Clinical Social Worker  Direct Dial: [phone]

## 2023-08-14 ENCOUNTER — Other Ambulatory Visit: Payer: Self-pay | Admitting: Pediatrics

## 2023-08-14 ENCOUNTER — Encounter: Payer: Self-pay | Admitting: Pediatrics

## 2023-08-14 ENCOUNTER — Ambulatory Visit (INDEPENDENT_AMBULATORY_CARE_PROVIDER_SITE_OTHER): Payer: PRIVATE HEALTH INSURANCE | Admitting: Pediatrics

## 2023-08-14 VITALS — BP 124/86 | HR 80 | Temp 98.6°F | Wt 329.6 lb

## 2023-08-14 DIAGNOSIS — L732 Hidradenitis suppurativa: Secondary | ICD-10-CM

## 2023-08-14 DIAGNOSIS — H539 Unspecified visual disturbance: Secondary | ICD-10-CM | POA: Diagnosis not present

## 2023-08-14 MED ORDER — SALICYLIC ACID 6 % EX GEL: Freq: Every day | CUTANEOUS | 0 refills | Status: DC

## 2023-08-14 MED ORDER — CHLORHEXIDINE GLUCONATE 4 % EX SOLN: Freq: Every day | CUTANEOUS | 3 refills | Status: DC | PRN

## 2023-08-14 NOTE — Progress Notes (Signed)
 Office Visit  BP 124/86   Pulse 80   Temp 98.6 F (37 C) (Oral)   Wt (!) 329 lb 9.6 oz (149.5 kg)   LMP 08/02/2023 (Approximate)   SpO2 100%   BMI 48.67 kg/m    Subjective:    Patient ID: Deanna Barnes, female    DOB: Nov 07, 1995, 28 y.o.   MRN: [id number]  HPI: Deanna Barnes is a 28 y.o. female  Chief Complaint  Patient presents with   Follow-up    #HS follow up Has not yet been able to pick up the medications due to coverage She continues to have flares  #vision changes She was seen by optho Was told she had fluid behind the eye She would like a second opinion Insurance doesn't cover another glasses prescription  Relevant past medical, surgical, family and social history reviewed and updated as indicated. Interim medical history since our last visit reviewed. Allergies and medications reviewed and updated.  ROS per HPI unless specifically indicated above     Objective:    BP 124/86   Pulse 80   Temp 98.6 F (37 C) (Oral)   Wt (!) 329 lb 9.6 oz (149.5 kg)   LMP 08/02/2023 (Approximate)   SpO2 100%   BMI 48.67 kg/m   Wt Readings from Last 3 Encounters:  08/14/23 (!) 329 lb 9.6 oz (149.5 kg)  07/17/23 (!) 327 lb 3.2 oz (148.4 kg)  06/28/23 (!) 330 lb 6.4 oz (149.9 kg)     Physical Exam Constitutional:      Appearance: Normal appearance.  Pulmonary:     Effort: Pulmonary effort is normal.  Musculoskeletal:        General: Normal range of motion.  Skin:    Comments: Normal skin color  Neurological:     General: No focal deficit present.     Mental Status: She is alert. Mental status is at baseline.  Psychiatric:        Mood and Affect: Mood normal.        Behavior: Behavior normal.        Thought Content: Thought content normal.         08/14/2023    4:24 PM 07/22/2023    2:52 PM 07/17/2023    9:48 AM 06/28/2023   10:36 AM 07/05/2020    1:26 PM  Depression screen PHQ 2/9  Decreased Interest 0 0 0 0 0  Down, Depressed, Hopeless 0 0 0 1 0   PHQ - 2 Score 0 0 0 1 0  Altered sleeping 0  1 1 0  Tired, decreased energy 0  1 1 0  Change in appetite 0  0 0 0  Feeling bad or failure about yourself  0  0 0 0  Trouble concentrating 0  1 1 0  Moving slowly or fidgety/restless 0  0 0 0  Suicidal thoughts 0  0 0 0  PHQ-9 Score 0  3 4 0  Difficult doing work/chores Not difficult at all  Somewhat difficult Somewhat difficult        08/14/2023    4:25 PM 07/22/2023    2:53 PM 07/17/2023    9:48 AM 06/28/2023   10:36 AM  GAD 7 : Generalized Anxiety Score  Nervous, Anxious, on Edge 0 1 0 1  Control/stop worrying 0 1 1 1   Worry too much - different things 0 1 1 1   Trouble relaxing 0 1 1 1   Restless 0 0 0 0  Easily  annoyed or irritable 0 1 0 0  Afraid - awful might happen 0 1 0 1  Total GAD 7 Score 0 6 3 5   Anxiety Difficulty  Somewhat difficult Somewhat difficult Somewhat difficult       Assessment & Plan:  Assessment & Plan   Vision changes Requesting second opinion, will send external referral.  -     Ambulatory referral to Ophthalmology  Hidradenitis suppurativa Plan to trial salicylic acid  given benzoyl peroxide  not covered by insurance. Consider doxy therapy for 3 months if not successful.   Follow up plan: Return in about 4 weeks (around 09/11/2023) for HS.  [name], MD

## 2023-08-14 NOTE — Progress Notes (Signed)
 Sending chlorhexidine  wash for presumed HS wound care, benzoyl peroxide  w cost barrier  Deanna SHAUNNA Nett, MD

## 2023-08-14 NOTE — Patient Instructions (Addendum)
 You should get another call for eye appointment

## 2023-08-16 NOTE — Telephone Encounter (Signed)
 Requested medications are due for refill today.  See note  Requested medications are on the active medications list.  yes  Last refill. 08/14/2023  Future visit scheduled.   no  Notes to clinic.  Pharmacy comment: Alternative Requested:NOT AVAILABLE TO [facility].     Requested Prescriptions  Pending Prescriptions Disp Refills   salicylic acid  6 % gel [Pharmacy Med Name: SALICYLIC ACID  6% GEL] 40 g 0    Sig: APPLY TO AFFECTED AREA TOPICALLY EVERY DAY     Dermatology:  Acne preparations Passed - 08/16/2023 10:48 AM      Passed - Valid encounter within last 12 months    Recent Outpatient Visits           2 days ago Vision changes   [place] [facility] Herold Hadassah SQUIBB, MD   1 month ago Annual physical exam   [place] [facility] Herold Hadassah SQUIBB, MD   1 month ago PTSD (post-traumatic stress disorder)   [place] [facility] Herold Hadassah SQUIBB, MD

## 2023-08-19 ENCOUNTER — Ambulatory Visit: Payer: PRIVATE HEALTH INSURANCE | Admitting: Pediatrics

## 2023-08-20 ENCOUNTER — Encounter: Payer: Self-pay | Admitting: Pediatrics

## 2023-08-29 ENCOUNTER — Other Ambulatory Visit: Payer: PRIVATE HEALTH INSURANCE | Admitting: *Deleted

## 2023-08-29 NOTE — Patient Instructions (Signed)
 Visit InformationVisit Information  Thank you for taking time to visit with me today. Please don't hesitate to contact me if I can be of assistance to you before our next scheduled appointment.  Your next care management appointment is by telephone on 09/19/23 at 1pm    Please call the care guide team at [phone] if you need to cancel, schedule, or reschedule an appointment.   Please call the Suicide and Crisis Lifeline: 988 call the [place]  National Suicide Prevention Lifeline: (814)663-0072 or TTY: 515-322-2917 TTY 901-730-2912) to talk to a trained counselor call 1-800-273-TALK (toll free, 24 hour hotline) if you are experiencing a Mental Health or Behavioral Health Crisis or need someone to talk to.  Cornelious Bartolucci, LCSW [place]  [facility], [facility] Licensed Clinical Social Worker  Direct Dial: [phone]

## 2023-08-29 NOTE — Patient Outreach (Signed)
 Complex Care Management   Visit Note  08/29/2023  Name:  Deanna Barnes MRN: [id number] DOB: 29-Sep-1995  Situation: Referral received for Complex Care Management related to Mental/Behavioral Health diagnosis PTSD Patient continues with challenges dealing with the loss her boyfriend. I obtained verbal consent from Patient.  Visit completed with patient  on the phone  Background:   Past Medical History:  Diagnosis Date   Abscess of right breast 07/16/2022   Allergy 2012   Food allergy-Broccoli   Anemia    Asthma     Assessment: Patient Reported Symptoms:  Cognitive Cognitive Status: Alert and oriented to person, place, and time, Normal speech and language skills Cognitive/Intellectual Conditions Management [RPT]: None reported or documented in medical history or problem list   Health Maintenance Behaviors: None  Neurological Neurological Review of Symptoms: No symptoms reported    HEENT HEENT Symptoms Reported: Eye dryness HEENT Management Strategies: Medication therapy HEENT Comment: referred to eye specialist in [facility] for follow up-appt pending    Cardiovascular Cardiovascular Symptoms Reported: No symptoms reported    Respiratory Respiratory Symptoms Reported: No symptoms reported    Endocrine Endocrine Symptoms Reported: No symptoms reported    Gastrointestinal Gastrointestinal Symptoms Reported: No symptoms reported      Genitourinary Genitourinary Symptoms Reported: No symptoms reported    Integumentary Integumentary Symptoms Reported: Skin changes Additional Integumentary Details: Hidradenitis suppotrtive Skin Management Strategies: Routine screening  Musculoskeletal Musculoskelatal Symptoms Reviewed: No symptoms reported        Psychosocial Psychosocial Symptoms Reported: Anxiety - if selected complete GAD Additional Psychological Details: continued anxiety related to circumstances surounding loss of boyfriend , remains interested in ongoing mental health  support -application for disability pending Behavioral Management Strategies: Coping strategies, Counseling Major Change/Loss/Stressor/Fears (CP): Traumatic event Behaviors When Feeling Stressed/Fearful: continues to have a friend she talks to , prayer, gaming Techniques to [place] with Loss/Stress/Change: Diversional activities, Spiritual practice(s) Quality of Family Relationships: supportive Do you feel physically threatened by others?: No      08/14/2023    4:24 PM  Depression screen PHQ 2/9  Decreased Interest 0  Down, Depressed, Hopeless 0  PHQ - 2 Score 0  Altered sleeping 0  Tired, decreased energy 0  Change in appetite 0  Feeling bad or failure about yourself  0  Trouble concentrating 0  Moving slowly or fidgety/restless 0  Suicidal thoughts 0  PHQ-9 Score 0  Difficult doing work/chores Not difficult at all    There were no vitals filed for this visit.  Medications Reviewed Today   Medications were not reviewed in this encounter     Recommendation:   PCP Follow-up Specialty provider follow-up as scheduled  Follow Up Plan:   Telephone follow up appointment date/time:    Referral placed with [facility] for ongoing mental health  Felma Pfefferle, LCSW [place]  [facility], [facility] Licensed Clinical Social Worker  Direct Dial: [phone]

## 2023-08-30 ENCOUNTER — Other Ambulatory Visit: Payer: Self-pay | Admitting: Pediatrics

## 2023-08-30 DIAGNOSIS — L732 Hidradenitis suppurativa: Secondary | ICD-10-CM

## 2023-08-30 MED ORDER — SALICYLIC ACID 6 % EX GEL
Freq: Every day | CUTANEOUS | 0 refills | Status: DC
Start: 2023-08-30 — End: 2023-11-13

## 2023-09-12 ENCOUNTER — Encounter: Payer: Self-pay | Admitting: Pediatrics

## 2023-09-12 ENCOUNTER — Ambulatory Visit (INDEPENDENT_AMBULATORY_CARE_PROVIDER_SITE_OTHER): Admitting: Pediatrics

## 2023-09-12 VITALS — BP 128/83 | HR 90 | Temp 98.0°F | Wt 327.6 lb

## 2023-09-12 DIAGNOSIS — G4700 Insomnia, unspecified: Secondary | ICD-10-CM | POA: Diagnosis not present

## 2023-09-12 DIAGNOSIS — L732 Hidradenitis suppurativa: Secondary | ICD-10-CM

## 2023-09-12 DIAGNOSIS — F4310 Post-traumatic stress disorder, unspecified: Secondary | ICD-10-CM

## 2023-09-12 MED ORDER — TRAZODONE HCL 50 MG PO TABS
25.0000 mg | ORAL_TABLET | Freq: Every evening | ORAL | 3 refills | Status: AC | PRN
Start: 2023-09-12 — End: ?

## 2023-09-12 MED ORDER — SPIRONOLACTONE 100 MG PO TABS: 100.0000 mg | ORAL_TABLET | Freq: Every day | ORAL | 2 refills | Status: DC

## 2023-09-12 MED ORDER — DOXYCYCLINE HYCLATE 100 MG PO TABS
100.0000 mg | ORAL_TABLET | Freq: Two times a day (BID) | ORAL | 0 refills | Status: DC
Start: 2023-09-12 — End: 2023-10-16

## 2023-09-12 NOTE — Assessment & Plan Note (Signed)
 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 Recent atypical nodule on back consistent with HS. Lesions not infected. Topical acid improved chest area. Condition persistent, requires systemic therapy. - Prescribed doxycycline  for 14 days. - Prescribed spironolactone . - Prescribed fluconazole . - Referred to dermatology for potential biologic therapy at [facility] clinic in [place]. - Advised topical acid application to back.

## 2023-09-12 NOTE — Assessment & Plan Note (Signed)
 Chronic insomnia worsened by PTSD and stressors, causing sleep difficulty and fatigue. Recent events disrupted sleep further. - Prescribed trazodone  as needed, starting with lowest dose.

## 2023-09-12 NOTE — Patient Instructions (Addendum)
 For HS: Start Doxycycline  (antibiotic) twice daily for 14 days Start spironolactone  (lowers testosterone) 100mg  daily - this will be more long term daily  Diflucan - for after the antibiotic (for yeast infection)  Trazadone as needed for sleep (take 1/2 tab to 1 pill)

## 2023-09-12 NOTE — Assessment & Plan Note (Signed)
 Chronic PTSD with significant triggers, exacerbated by environmental stressors and recent encounters. Engaged in therapy, seeking housing relocation to reduce triggers. - Continue therapy with Child psychotherapist. - Assist with housing placement efforts.
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
## 2023-09-19 ENCOUNTER — Other Ambulatory Visit: Payer: Self-pay | Admitting: *Deleted

## 2023-09-19 NOTE — Patient Instructions (Signed)
 Visit Information  Thank you for taking time to visit with me today. Please don't hesitate to contact me if I can be of assistance to you before our next scheduled appointment.  Your next care management appointment is by telephone on 10/08/23 at 11am    Please call the care guide team at [phone] if you need to cancel, schedule, or reschedule an appointment.   Please call the Suicide and Crisis Lifeline: 988 call the [place]  National Suicide Prevention Lifeline: 512-473-3690 or TTY: (351)004-9819 TTY 714-067-1137) to talk to a trained counselor call 1-800-273-TALK (toll free, 24 hour hotline) if you are experiencing a Mental Health or Behavioral Health Crisis or need someone to talk to.  Nikka Hakimian, LCSW [place]  [facility], [facility] Licensed Clinical Social Worker  Direct Dial: [phone]

## 2023-09-19 NOTE — Patient Outreach (Signed)
 Complex Care Management   Visit Note  09/19/2023  Name:  Deanna Barnes MRN: [id number] DOB: 1995-04-21  Situation: Referral received for Complex Care Management related to Mental/Behavioral Health diagnosis Grief and loss I obtained verbal consent from Patient.  Visit completed with Patient  on the phone  Background:   Past Medical History:  Diagnosis Date   Abscess of right breast 07/16/2022   Allergy 2012   Food allergy-Broccoli   Anemia    Asthma     Assessment: Patient Reported Symptoms:  Cognitive        Neurological Neurological Review of Symptoms: No symptoms reported    HEENT HEENT Symptoms Reported: Eye dryness HEENT Management Strategies: Medication therapy HEENT Comment: referred to eye specialist in [facility] for follow up-appt remains pending    Cardiovascular Cardiovascular Symptoms Reported: No symptoms reported    Respiratory Respiratory Symptoms Reported: No symptoms reported    Endocrine Endocrine Symptoms Reported: No symptoms reported    Gastrointestinal Gastrointestinal Symptoms Reported: No symptoms reported      Genitourinary Genitourinary Symptoms Reported: No symptoms reported    Integumentary Integumentary Symptoms Reported: Skin changes Additional Integumentary Details: Hidradenitis suppurativa Skin Management Strategies: Routine screening Skin Self-Management Outcome: 3 (uncertain) Skin Comment: managed by PCP-last visit 09/12/23  Musculoskeletal Musculoskelatal Symptoms Reviewed: No symptoms reported        Psychosocial Psychosocial Symptoms Reported: Anxiety - if selected complete GAD Additional Psychological Details: continued anxiety related to circumstances surrounding loss of boyfriend-despite determination that patient died of natural causes, patient feels that she is being blamed-remains ongoing mental health supporti intial appt with [facility] for 09/24/23 Behavioral Management Strategies: Coping strategies, Counseling Major  Change/Loss/Stressor/Fears (CP): Traumatic event Behaviors When Feeling Stressed/Fearful: continues to have a friend she talkd to , prayer, gaming Techniques to [employer] with Loss/Stress/Change: Diversional activities, Spiritual practice(s) Quality of Family Relationships: supportive Do you feel physically threatened by others?: No    09/19/2023    PHQ2-9 Depression Screening   Little interest or pleasure in doing things    Feeling down, depressed, or hopeless    PHQ-2 - Total Score    Trouble falling or staying asleep, or sleeping too much    Feeling tired or having little energy    Poor appetite or overeating     Feeling bad about yourself - or that you are a failure or have let yourself or your family down    Trouble concentrating on things, such as reading the newspaper or watching television    Moving or speaking so slowly that other people could have noticed.  Or the opposite - being so fidgety or restless that you have been moving around a lot more than usual    Thoughts that you would be better off dead, or hurting yourself in some way    PHQ2-9 Total Score    If you checked off any problems, how difficult have these problems made it for you to do your work, take care of things at home, or get along with other people    Depression Interventions/Treatment      There were no vitals filed for this visit.  Medications Reviewed Today     Reviewed by Ermalinda Lenn CHRISTELLA, LCSW (Social Worker) on 09/19/23 at 1332  Med List Status: <None>   Medication Order Taking? Sig Documenting Provider Last Dose Status Informant  chlorhexidine  (HIBICLENS ) 4 % external liquid [id number]  Apply topically daily as needed.  Patient not taking: Reported on 08/14/2023   Herold Ip  P, MD  Active   Clindamycin  Phos, Once-Daily, (CLINDAGEL) 1 % GEL [id number]  Apply 1 Units topically as needed. Apply to active lesions Herold Hadassah SQUIBB, MD  Active   doxycycline  (VIBRA -TABS) 100 MG tablet [id number]  Take 1 tablet  (100 mg total) by mouth 2 (two) times daily for 14 days. Herold Hadassah SQUIBB, MD  Active   salicylic acid  6 % gel [id number]  Apply topically daily. Herold Hadassah SQUIBB, MD  Active   spironolactone  (ALDACTONE ) 100 MG tablet [id number]  Take 1 tablet (100 mg total) by mouth daily. Herold Hadassah SQUIBB, MD  Active   traZODone  (DESYREL ) 50 MG tablet [id number]  Take 0.5-1 tablets (25-50 mg total) by mouth at bedtime as needed for sleep. Herold Hadassah SQUIBB, MD  Active             Recommendation:   PCP Follow-up Initial appointment with [facility] 09/24/23 at 1pm  Follow Up Plan:   Telephone follow up appointment date/time:  09/24/23 1pm  Luqman Perrelli, LCSW [place]  [facility], [facility] Licensed Clinical Social Worker  Direct Dial: [phone]

## 2023-10-08 ENCOUNTER — Other Ambulatory Visit: Payer: Self-pay | Admitting: *Deleted

## 2023-10-09 ENCOUNTER — Ambulatory Visit (INDEPENDENT_AMBULATORY_CARE_PROVIDER_SITE_OTHER): Admitting: Pediatrics

## 2023-10-09 ENCOUNTER — Encounter: Payer: Self-pay | Admitting: Pediatrics

## 2023-10-09 VITALS — BP 121/82 | HR 80 | Temp 98.2°F | Wt 331.0 lb

## 2023-10-09 DIAGNOSIS — G4700 Insomnia, unspecified: Secondary | ICD-10-CM | POA: Diagnosis not present

## 2023-10-09 DIAGNOSIS — F4310 Post-traumatic stress disorder, unspecified: Secondary | ICD-10-CM

## 2023-10-09 DIAGNOSIS — Z113 Encounter for screening for infections with a predominantly sexual mode of transmission: Secondary | ICD-10-CM | POA: Diagnosis not present

## 2023-10-09 DIAGNOSIS — L732 Hidradenitis suppurativa: Secondary | ICD-10-CM | POA: Diagnosis not present

## 2023-10-09 MED ORDER — SPIRONOLACTONE 100 MG PO TABS: 100.0000 mg | ORAL_TABLET | Freq: Every day | ORAL | 3 refills | Status: AC

## 2023-10-09 NOTE — Assessment & Plan Note (Signed)
 Managed with trazodone , causing prolonged sleep duration. - Advise taking trazodone  earlier in the evening.

## 2023-10-09 NOTE — Patient Instructions (Signed)
 Visit Information  Thank you for taking time to visit with me today. Please don't hesitate to contact me if I can be of assistance to you before our next scheduled appointment.  Your next care management appointment is no further scheduled appointments.   Patient has met all care management goals. Care Management case will be closed. Patient has been provided contact information should new needs arise.   Please call the care guide team at [phone] if you need to cancel, schedule, or reschedule an appointment.   Please call the Suicide and Crisis Lifeline: 988 call the [place]  National Suicide Prevention Lifeline: 3066119021 or TTY: (561) 612-6624 TTY [phone]) to talk to a trained counselor call 1-800-273-TALK (toll free, 24 hour hotline) if you are experiencing a Mental Health or Behavioral Health Crisis or need someone to talk to.  Kade Demicco, LCSW [place]  [facility], [facility] Licensed Clinical Social Worker  Direct Dial: [phone]

## 2023-10-09 NOTE — Assessment & Plan Note (Addendum)
 Chronic lesions on chest, arms, and back. New spot noted. Lesions not infected. Dermatology referral pending. Spironolactone  used to prevent HS flares. Current dose unlikely to cause hyperkalemia. Previous potassium levels checked. - albs as below - Finish current doxycycline  course. - Consider extending doxycycline . - Follow up with dermatology.

## 2023-10-09 NOTE — Progress Notes (Signed)
 Office Visit  BP 121/82   Pulse 80   Temp 98.2 F (36.8 C) (Oral)   Wt (!) 331 lb (150.1 kg)   LMP 09/21/2023   SpO2 98%   BMI 48.88 kg/m    Subjective:    Patient ID: Deanna Barnes, female    DOB: 1995-07-02, 28 y.o.   MRN: [id number]  HPI: Deanna Barnes is a 28 y.o. female  Chief Complaint  Patient presents with   Follow-up    Discussed the use of AI scribe software for clinical note transcription with the patient, who gave verbal consent to proceed.  History of Present Illness   Deanna Barnes is a 28 year old female with hidradenitis suppurativa who presents for medication management and follow-up.  She is managing her hidradenitis suppurativa with doxycycline , taken once daily at night, and spironolactone , taken daily without issues. She is nearly finished with her current doxycycline  prescription. She uses semironic acid topically on her chest, arms, back, and sides, but has been out of it for three to three and a half days due to a pharmacy delay. The medication helps with discomfort, especially in skin creases, but she is applying it to more areas than initially anticipated, leading to quicker depletion.  She has noticed a new spot on her back, in addition to two existing spots, one of which has broken. The spots are not painful but cause discomfort.  She is taking trazodone  to aid with sleep, which is effective but causes prolonged sleep, sometimes until late morning. She is managing her iron levels with iron supplements instead of Geritol, due to her plasma donation activities.  She has recently started therapy, attending two sessions so far, and is working through past and current stressors, including paranoia and anxiety related to her living situation. She feels overwhelmed and is considering relocation to alleviate some of her stressors. She occasionally uses ashwagandha gummies to help with anxiety and finds them effective in calming her.  She is concerned  about her potassium levels due to spironolactone  use and is open to having this monitored.       Relevant past medical, surgical, family and social history reviewed and updated as indicated. Interim medical history since our last visit reviewed. Allergies and medications reviewed and updated.  ROS per HPI unless specifically indicated above     Objective:    BP 121/82   Pulse 80   Temp 98.2 F (36.8 C) (Oral)   Wt (!) 331 lb (150.1 kg)   LMP 09/21/2023   SpO2 98%   BMI 48.88 kg/m   Wt Readings from Last 3 Encounters:  10/09/23 (!) 331 lb (150.1 kg)  09/12/23 (!) 327 lb 9.6 oz (148.6 kg)  08/14/23 (!) 329 lb 9.6 oz (149.5 kg)     Physical Exam Constitutional:      Appearance: Normal appearance.  Pulmonary:     Effort: Pulmonary effort is normal.  Musculoskeletal:        General: Normal range of motion.  Skin:    Comments: Normal skin color  Neurological:     General: No focal deficit present.     Mental Status: She is alert. Mental status is at baseline.  Psychiatric:        Mood and Affect: Mood normal.        Behavior: Behavior normal.        Thought Content: Thought content normal.         10/09/2023   11:07 AM  09/12/2023    9:15 AM 08/14/2023    4:24 PM 07/22/2023    2:52 PM 07/17/2023    9:48 AM  Depression screen PHQ 2/9  Decreased Interest 1 0 0 0 0  Down, Depressed, Hopeless 0 1 0 0 0  PHQ - 2 Score 1 1 0 0 0  Altered sleeping 2 2 0  1  Tired, decreased energy 2 1 0  1  Change in appetite 0 0 0  0  Feeling bad or failure about yourself  0 0 0  0  Trouble concentrating 1 1 0  1  Moving slowly or fidgety/restless 0 0 0  0  Suicidal thoughts 0 0 0  0  PHQ-9 Score 6 5 0  3  Difficult doing work/chores Somewhat difficult Somewhat difficult Not difficult at all  Somewhat difficult       10/09/2023   11:07 AM 09/12/2023    9:18 AM 08/14/2023    4:25 PM 07/22/2023    2:53 PM  GAD 7 : Generalized Anxiety Score  Nervous, Anxious, on Edge 1 2 0 1   Control/stop worrying 1 2 0 1  Worry too much - different things 1 2 0 1  Trouble relaxing 1 1 0 1  Restless 0 0 0 0  Easily annoyed or irritable 1 0 0 1  Afraid - awful might happen 1 0 0 1  Total GAD 7 Score 6 7 0 6  Anxiety Difficulty Somewhat difficult Very difficult  Somewhat difficult       Assessment & Plan:  Assessment & Plan   Hidradenitis suppurativa Assessment & Plan: Chronic lesions on chest, arms, and back. New spot noted. Lesions not infected. Dermatology referral pending. Spironolactone  used to prevent HS flares. Current dose unlikely to cause hyperkalemia. Previous potassium levels checked. - albs as below - Finish current doxycycline  course. - Consider extending doxycycline . - Follow up with dermatology.  Orders: -     Spironolactone ; Take 1 tablet (100 mg total) by mouth daily.  Dispense: 90 tablet; Refill: 3 -     Basic metabolic panel with GFR -     Ambulatory referral to Dermatology  PTSD (post-traumatic stress disorder) Assessment & Plan: Undergoing therapy focused on past experiences and stressors. Reports feeling overwhelmed and paranoid. - Continue therapy sessions.    Insomnia, unspecified type Assessment & Plan: Managed with trazodone , causing prolonged sleep duration. - Advise taking trazodone  earlier in the evening.   Screen for STD (sexually transmitted disease) Requesting test. -     HSV 1 and 2 Ab, IgG  Follow up plan: Return in about 4 weeks (around 11/06/2023).  Hadassah Deanna Nett, MD

## 2023-10-09 NOTE — Patient Outreach (Addendum)
 Complex Care Management   Visit Note  10/09/2023  Name:  Deanna Barnes MRN: [id number] DOB: [birth year]  Situation: Referral received for Complex Care Management related to Mental/Behavioral Health diagnosis PTSD Patient continues with challenges dealing with the loss her boyfriend. I obtained verbal consent from Patient. Visit completed with patient on the phone on 10/08/23. Background:   Past Medical History:  Diagnosis Date   Abscess of right breast 07/16/2022   Allergy 2012   Food allergy-Broccoli   Anemia    Asthma     Assessment: Patient Reported Symptoms:  Cognitive Cognitive Status: Alert and oriented to person, place, and time, Normal speech and language skills Cognitive/Intellectual Conditions Management [RPT]: None reported or documented in medical history or problem list   Health Maintenance Behaviors: Annual physical exam Healing Pattern: Average Health Facilitated by: Stress management  Neurological Neurological Review of Symptoms: No symptoms reported    HEENT HEENT Symptoms Reported: Ear dryness HEENT Management Strategies: Medication therapy HEENT Comment: referred to eye specialist in [place] for follow up-has informationt to schedule-working on transportation to get there-    Cardiovascular Cardiovascular Symptoms Reported: No symptoms reported    Respiratory Respiratory Symptoms Reported: No symptoms reported    Endocrine Endocrine Symptoms Reported: No symptoms reported    Gastrointestinal Gastrointestinal Symptoms Reported: No symptoms reported      Genitourinary Genitourinary Symptoms Reported: No symptoms reported    Integumentary Integumentary Symptoms Reported: Skin changes Additional Integumentary Details: Hidradenitis suppurativa Skin Management Strategies: Routine screening Skin Comment: managed by PCP  Musculoskeletal Musculoskelatal Symptoms Reviewed: No symptoms reported        Psychosocial Psychosocial Symptoms Reported: Anxiety - if  selected complete GAD          10/09/2023    PHQ2-9 Depression Screening   Little interest or pleasure in doing things    Feeling down, depressed, or hopeless    PHQ-2 - Total Score    Trouble falling or staying asleep, or sleeping too much    Feeling tired or having little energy    Poor appetite or overeating     Feeling bad about yourself - or that you are a failure or have let yourself or your family down    Trouble concentrating on things, such as reading the newspaper or watching television    Moving or speaking so slowly that other people could have noticed.  Or the opposite - being so fidgety or restless that you have been moving around a lot more than usual    Thoughts that you would be better off dead, or hurting yourself in some way    PHQ2-9 Total Score    If you checked off any problems, how difficult have these problems made it for you to do your work, take care of things at home, or get along with other people    Depression Interventions/Treatment      There were no vitals filed for this visit.  Medications Reviewed Today     Reviewed by Ermalinda Lenn CHRISTELLA, LCSW (Social Worker) on 10/08/23 at 1110  Med List Status: <None>   Medication Order Taking? Sig Documenting Provider Last Dose Status Informant  chlorhexidine  (HIBICLENS ) 4 % external liquid [id number]  Apply topically daily as needed.  Patient not taking: Reported on 08/14/2023   Herold Hadassah SQUIBB, MD  Active   Clindamycin  Phos, Once-Daily, (CLINDAGEL) 1 % GEL [id number]  Apply 1 Units topically as needed. Apply to active lesions Herold Hadassah SQUIBB, MD  Active   salicylic  acid 6 % gel [id number]  Apply topically daily. Herold Hadassah SQUIBB, MD  Active   spironolactone  (ALDACTONE ) 100 MG tablet [id number]  Take 1 tablet (100 mg total) by mouth daily. Herold Hadassah SQUIBB, MD  Active   traZODone  (DESYREL ) 50 MG tablet [id number]  Take 0.5-1 tablets (25-50 mg total) by mouth at bedtime as needed for sleep. Herold Hadassah SQUIBB, MD  Active              Recommendation:   PCP Follow-up Continued follow up with [facility] for ongoing mental health counseling  Follow Up Plan:   Patient has met all care management goals. Care Management case will be closed. Patient has been provided contact information should new needs arise.   Athens Lebeau, LCSW   [facility] Licensed Clinical Social Worker  Direct Dial: [phone]

## 2023-10-09 NOTE — Assessment & Plan Note (Signed)
 Undergoing therapy focused on past experiences and stressors. Reports feeling overwhelmed and paranoid. - Continue therapy sessions.

## 2023-10-10 LAB — BASIC METABOLIC PANEL WITH GFR
BUN/Creatinine Ratio: 10 (ref 9–23)
BUN: 8 mg/dL (ref 6–20)
CO2: 19 mmol/L — ABNORMAL LOW (ref 20–29)
Calcium: 8.9 mg/dL (ref 8.7–10.2)
Chloride: 107 mmol/L — ABNORMAL HIGH (ref 96–106)
Creatinine, Ser: 0.8 mg/dL (ref 0.57–1.00)
Glucose: 100 mg/dL — ABNORMAL HIGH (ref 70–99)
Potassium: 4.2 mmol/L (ref 3.5–5.2)
Sodium: 140 mmol/L (ref 134–144)
eGFR: 103 mL/min/1.73 (ref >59)

## 2023-10-10 LAB — HSV 1 AND 2 AB, IGG
HSV 1 Glycoprotein G Ab, IgG: NONREACTIVE
HSV 2 IgG, Type Spec: NONREACTIVE

## 2023-10-11 ENCOUNTER — Ambulatory Visit: Payer: Self-pay | Admitting: Pediatrics

## 2023-10-15 ENCOUNTER — Ambulatory Visit: Admitting: Pediatrics

## 2023-10-16 ENCOUNTER — Other Ambulatory Visit: Payer: Self-pay | Admitting: Pediatrics

## 2023-10-16 DIAGNOSIS — L732 Hidradenitis suppurativa: Secondary | ICD-10-CM

## 2023-10-16 MED ORDER — DOXYCYCLINE HYCLATE 100 MG PO TABS
100.0000 mg | ORAL_TABLET | Freq: Two times a day (BID) | ORAL | 0 refills | Status: AC
Start: 2023-10-16 — End: 2023-10-30

## 2023-10-16 NOTE — Progress Notes (Signed)
 Sent requested 14 day refill of doxy  Deanna SHAUNNA Nett, MD

## 2023-10-18 ENCOUNTER — Ambulatory Visit (INDEPENDENT_AMBULATORY_CARE_PROVIDER_SITE_OTHER): Admitting: Pediatrics

## 2023-10-18 ENCOUNTER — Encounter: Payer: Self-pay | Admitting: Pediatrics

## 2023-10-18 VITALS — BP 134/84 | HR 84 | Ht 69.0 in | Wt 332.6 lb

## 2023-10-18 DIAGNOSIS — G8929 Other chronic pain: Secondary | ICD-10-CM

## 2023-10-18 DIAGNOSIS — R42 Dizziness and giddiness: Secondary | ICD-10-CM | POA: Diagnosis not present

## 2023-10-18 DIAGNOSIS — H60502 Unspecified acute noninfective otitis externa, left ear: Secondary | ICD-10-CM | POA: Diagnosis not present

## 2023-10-18 DIAGNOSIS — M545 Low back pain, unspecified: Secondary | ICD-10-CM

## 2023-10-18 MED ORDER — OFLOXACIN 0.3 % OT SOLN
5.0000 [drp] | Freq: Two times a day (BID) | OTIC | 0 refills | Status: AC
Start: 2023-10-18 — End: 2023-10-23

## 2023-10-18 NOTE — Patient Instructions (Addendum)
 [email or web address]   [facility] [address] [place] [address]   Get Directions to [employer]  Phone: [phone] Fax: [phone] Email: [email or web address]
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
## 2023-10-24 ENCOUNTER — Ambulatory Visit: Admitting: Pediatrics

## 2023-10-31 ENCOUNTER — Encounter: Payer: Self-pay | Admitting: General Surgery

## 2023-10-31 ENCOUNTER — Ambulatory Visit (INDEPENDENT_AMBULATORY_CARE_PROVIDER_SITE_OTHER): Admitting: General Surgery

## 2023-10-31 ENCOUNTER — Encounter: Payer: Self-pay | Admitting: Pediatrics

## 2023-10-31 ENCOUNTER — Telehealth: Payer: Self-pay | Admitting: Pediatrics

## 2023-10-31 ENCOUNTER — Ambulatory Visit (INDEPENDENT_AMBULATORY_CARE_PROVIDER_SITE_OTHER): Admitting: Pediatrics

## 2023-10-31 VITALS — BP 180/71 | HR 70 | Temp 97.8°F | Wt 335.4 lb

## 2023-10-31 VITALS — BP 145/79 | HR 80 | Temp 97.8°F | Ht 69.0 in | Wt 333.0 lb

## 2023-10-31 DIAGNOSIS — L732 Hidradenitis suppurativa: Secondary | ICD-10-CM

## 2023-10-31 DIAGNOSIS — N611 Abscess of the breast and nipple: Secondary | ICD-10-CM | POA: Diagnosis not present

## 2023-10-31 MED ORDER — SULFAMETHOXAZOLE-TRIMETHOPRIM 800-160 MG PO TABS
1.0000 | ORAL_TABLET | Freq: Two times a day (BID) | ORAL | 0 refills | Status: AC
Start: 2023-10-31 — End: 2023-11-07

## 2023-10-31 NOTE — Patient Instructions (Addendum)
 If the area starts draining, give our office a call so we can see you   Hidradenitis Suppurativa Hidradenitis suppurativa is a long-term (chronic) skin disease. It is similar to a severe form of acne, but it affects areas of the body where acne would be unusual, especially areas of the body where skin rubs against skin and becomes moist. These include: Underarms. Groin. Genital area. Buttocks. Upper thighs. Breasts. Hidradenitis suppurativa may start out as small lumps or pimples caused by blocked skin pores, sweat glands, or hair follicles. Pimples may develop into deep sores that break open (rupture) and drain pus. Over time, affected areas of skin may thicken and become scarred. This condition is rare and does not spread from person to person (non-contagious). What are the causes? The exact cause of this condition is not known. It may be related to: Female and female hormones. An overactive disease-fighting system (immune system). The immune system may over-react to blocked hair follicles or sweat glands and cause swelling and pus-filled sores. What increases the risk? You are more likely to develop this condition if you: Are female. Are 58-28 years old. Have a family history of hidradenitis suppurativa. Have a personal history of acne. Are overweight. Smoke. Take the medicine lithium. What are the signs or symptoms? The first symptoms are usually painful bumps in the skin, similar to pimples. The condition may get worse over time (progress), or it may only cause mild symptoms. If the disease progresses, symptoms may include: Skin bumps getting bigger and growing deeper into the skin. Bumps rupturing and draining pus. Itchy, infected skin. Skin getting thicker and scarred. Tunnels under the skin (fistulas) where pus drains from a bump. Pain during daily activities, such as pain during walking if your groin area is affected. Emotional problems, such as stress or depression. This  condition may affect your appearance and your ability or willingness to wear certain clothes or do certain activities. How is this diagnosed? This condition is diagnosed by a health care provider who specializes in skin conditions (dermatologist). You may be diagnosed based on: Your symptoms and medical history. A physical exam. Testing a pus sample for infection. Blood tests. How is this treated? Your treatment will depend on how severe your symptoms are. The same treatment will not work for everybody with this condition. You may need to try several treatments to find what works best for you. Treatment may include: Cleaning and bandaging (dressing) your wounds as needed. Lifestyle changes, such as new skin care routines. Taking medicines, such as: Antibiotics. Acne medicines. Medicines to reduce the activity of the immune system. A diabetes medicine (metformin). Birth control pills, for women. Steroids to reduce swelling and pain. Working with a mental health care provider, if you experience emotional distress due to this condition. If you have severe symptoms that do not get better with medicine, you may need surgery. Surgery may involve: Using a laser to clear the skin and remove hair follicles. Opening and draining deep sores. Removing the areas of skin that are diseased and scarred. Follow these instructions at home: Medicines  Take over-the-counter and prescription medicines only as told by your health care provider. If you were prescribed antibiotics, take them as told by your health care provider. Do not stop using the antibiotic even if your condition improves. Skin care If you have open wounds, cover them with a clean dressing as told by your health care provider. Keep wounds clean by washing them gently with soap and water when you  bathe. Do not shave the areas where you get hidradenitis suppurativa. Wear loose-fitting clothes. Try to avoid getting overheated or sweaty. If  you get sweaty or wet, change into clean, dry clothes as soon as you can. To help relieve pain and itchiness, cover sore areas with a warm, clean washcloth (warm compress) for 5-10 minutes as often as needed. Your healthcare provider may recommend an antiperspirant deodorant that may be gentle on your skin. A daily antiseptic wash to cleanse affected areas may be suggested by your healthcare provider. General instructions Learn as much as you can about your disease so that you have an active role in your treatment. Work closely with your health care provider to find treatments that work for you. If you are overweight, work with your health care provider to lose weight as recommended. Do not use any products that contain nicotine or tobacco. These products include cigarettes, chewing tobacco, and vaping devices, such as e-cigarettes. If you need help quitting, ask your health care provider. If you struggle with living with this condition, talk with your health care provider or work with a mental health care provider as recommended. Keep all follow-up visits. Where to find more information Hidradenitis Suppurativa Foundation, Inc.: www.hs-foundation.org American Academy of Dermatology: InfoExam.si Contact a health care provider if: You have a flare-up of hidradenitis suppurativa. You have a fever or chills. You have trouble controlling your symptoms at home. You have trouble doing your daily activities because of your symptoms. You have trouble dealing with emotional problems related to your condition. Summary Hidradenitis suppurativa is a long-term (chronic) skin disease. It is similar to a severe form of acne, but it affects areas of the body where acne would be unusual. The first symptoms are usually painful bumps in the skin, similar to pimples. The condition may only cause mild symptoms, or it may get worse over time (progress). If you have open wounds, cover them with a clean dressing as  told by your health care provider. Keep wounds clean by washing them gently with soap and water when you bathe. Besides skin care, treatment may include medicines, laser treatment, and surgery. This information is not intended to replace advice given to you by your health care provider. Make sure you discuss any questions you have with your health care provider. Document Revised: 03/01/2021 Document Reviewed: 03/01/2021 Elsevier Patient Education  2024 [employer].

## 2023-10-31 NOTE — Progress Notes (Signed)
 Office Visit  BP (!) 180/71   Pulse 70   Temp 97.8 F (36.6 C) (Oral)   Wt (!) 335 lb 6.4 oz (152.1 kg)   LMP 09/21/2023   SpO2 98%   BMI 49.53 kg/m    Subjective:    Patient ID: Deanna Barnes, female    DOB: 1995/02/27, 28 y.o.   MRN: [id number]  HPI: Deanna Barnes is a 28 y.o. female  Chief Complaint  Patient presents with   Flank Pain    Discussed the use of AI scribe software for clinical note transcription with the patient, who gave verbal consent to proceed.  History of Present Illness   Deanna Barnes is a 28 year old female who presents with recurrent breast inflammation and pain.  She experiences recurrent breast inflammation and pain, which worsened after discontinuing her medication, leading to fluid buildup and significant discomfort. The pain was severe and disrupted her sleep, especially when lying on her side. She noted that her blood pressure was elevated during this time and wondered if it was related to the pain.  Initially, the inflammation extended from her back to the front of her chest but has somewhat reduced with medication. She is currently taking doxycycline  twice a day and spironolactone . She reports that the inflammation has somewhat reduced with medication, but she still feels the area is warm, hard, and painful.  She has not noticed any drainage from the area, although she can feel fluid inside. The inflammation is located in the breast area, which has previously required consultation with a breast surgeon due to its complexity and her age, as she is still in her childbearing years.  She has been in contact with dermatology, but they have not yet scheduled an appointment. She has previously visited the [facility] emergency room, where it was determined that a breast surgeon might be necessary for further intervention.        Relevant past medical, surgical, family and social history reviewed and updated as indicated. Interim medical  history since our last visit reviewed. Allergies and medications reviewed and updated.  ROS per HPI unless specifically indicated above     Objective:    BP (!) 180/71   Pulse 70   Temp 97.8 F (36.6 C) (Oral)   Wt (!) 335 lb 6.4 oz (152.1 kg)   LMP 09/21/2023   SpO2 98%   BMI 49.53 kg/m   Wt Readings from Last 3 Encounters:  10/31/23 (!) 335 lb 6.4 oz (152.1 kg)  10/18/23 (!) 332 lb 9.6 oz (150.9 kg)  10/09/23 (!) 331 lb (150.1 kg)     Physical Exam Constitutional:      Appearance: Normal appearance.  Pulmonary:     Effort: Pulmonary effort is normal.  Musculoskeletal:        General: Normal range of motion.  Skin:    Findings: Abscess present.     Comments: Right breast with large abscess below the skin, warm to touch, hyperpigmented. Not draining.  Neurological:     General: No focal deficit present.     Mental Status: She is alert. Mental status is at baseline.  Psychiatric:        Mood and Affect: Mood normal.        Behavior: Behavior normal.        Thought Content: Thought content normal.         10/31/2023    1:46 PM 10/18/2023    2:15 PM 10/09/2023  11:07 AM 09/12/2023    9:15 AM 08/14/2023    4:24 PM  Depression screen PHQ 2/9  Decreased Interest 2 1 1  0 0  Down, Depressed, Hopeless 2 1 0 1 0  PHQ - 2 Score 4 2 1 1  0  Altered sleeping 2 1 2 2  0  Tired, decreased energy 2 2 2 1  0  Change in appetite 0 0 0 0 0  Feeling bad or failure about yourself  0 0 0 0 0  Trouble concentrating 1 1 1 1  0  Moving slowly or fidgety/restless 0 0 0 0 0  Suicidal thoughts 0 0 0 0 0  PHQ-9 Score 9 6 6 5  0  Difficult doing work/chores Very difficult  Somewhat difficult Somewhat difficult Not difficult at all       10/31/2023    1:46 PM 10/18/2023    2:15 PM 10/09/2023   11:07 AM 09/12/2023    9:18 AM  GAD 7 : Generalized Anxiety Score  Nervous, Anxious, on Edge 1 1 1 2   Control/stop worrying 1 1 1 2   Worry too much - different things 1 1 1 2   Trouble relaxing 2  1 1 1   Restless 0 0 0 0  Easily annoyed or irritable 2 1 1  0  Afraid - awful might happen 1 1 1  0  Total GAD 7 Score 8 6 6 7   Anxiety Difficulty Very difficult Somewhat difficult Somewhat difficult Very difficult       Assessment & Plan:  Assessment & Plan   Abscess of right breast Patient w underlying HS presenting with recurrence of right breast abscess. Not draining but very painful and taught to skin. She is on chronic doxycycline  for HS, but will dc and switch to bactrim  in case can provided better coverage for breast tissue. Referring to general surgery for I&D. Pt given strict return precautions and anticipatory guidance for ED. -     Sulfamethoxazole -Trimethoprim ; Take 1 tablet by mouth 2 (two) times daily for 7 days.  Dispense: 14 tablet; Refill: 0 -     Ambulatory referral to General Surgery   Follow up plan: Return if symptoms worsen or fail to improve.  [name], MD  Approximately 30 minutes spent on patient encounter today including assessment, counseling, diagnosing, treatment plan development, and charting.

## 2023-10-31 NOTE — Patient Instructions (Addendum)
 Stop doxycycline , switch to bactrim    General surgery will call you

## 2023-10-31 NOTE — Telephone Encounter (Signed)
 Copied from CRM [id number]. Topic: Clinical - Medication Question >> Oct 31, 2023  3:51 PM Shanda MATSU wrote: Reason for CRM: Patient calling in regards to medicine that provider adv her she was going to prescribe for her due to pain not being sent to pharmacy, patient stated provider adv her she was going to send either extra strength tylenol  or ibuprofen  that cannot be purchased OTC, however, pharmacy did not recv a req for this med, please contact patient back in regards to this.

## 2023-11-05 ENCOUNTER — Ambulatory Visit: Admitting: Pediatrics

## 2023-11-05 MED ORDER — IBUPROFEN 600 MG PO TABS: 600.0000 mg | ORAL_TABLET | Freq: Three times a day (TID) | ORAL | 0 refills | Status: DC | PRN

## 2023-11-05 NOTE — Telephone Encounter (Signed)
 Called and spoke with pt she has stopped the doxycycline  she is requesting flagy to be sent in for a yeast infection she states that it was discussed at last visit

## 2023-11-05 NOTE — Addendum Note (Signed)
 Addended by: Nicko Daher P on: 11/05/2023 03:14 PM   Modules accepted: Orders

## 2023-11-06 ENCOUNTER — Other Ambulatory Visit: Payer: Self-pay | Admitting: Pediatrics

## 2023-11-06 DIAGNOSIS — T3695XA Adverse effect of unspecified systemic antibiotic, initial encounter: Secondary | ICD-10-CM

## 2023-11-06 MED ORDER — FLUCONAZOLE 150 MG PO TABS
150.0000 mg | ORAL_TABLET | Freq: Once | ORAL | 0 refills | Status: AC
Start: 2023-11-06 — End: 2023-11-06

## 2023-11-06 NOTE — Progress Notes (Signed)
 Requesting yeast infection tx after abx.  [name], MD

## 2023-11-07 ENCOUNTER — Other Ambulatory Visit: Payer: Self-pay | Admitting: Pediatrics

## 2023-11-07 NOTE — Telephone Encounter (Signed)
 Are any you aware if patient completed the needed medical records release form that was discussed in another mychart message. If not can we please give her a call and go over the process again with her. I believe she took care of requesting some records herself but was told she needed to sign a formal release and I don't know if she has done that.   Thanks for the assistance!

## 2023-11-08 NOTE — Telephone Encounter (Signed)
 Called patient to verify what type of records she was requesting. Stated she was requesting ALL of her medical records from [facility]Pediatric[facility] for herself and Insurance purposes. I informed her she would have to request those directly from [facility]. Patient verbalized understanding.

## 2023-11-13 ENCOUNTER — Ambulatory Visit: Payer: Self-pay | Admitting: Pediatrics

## 2023-11-13 ENCOUNTER — Encounter: Payer: Self-pay | Admitting: Pediatrics

## 2023-11-13 VITALS — BP 126/82 | HR 79 | Temp 97.8°F | Wt 331.2 lb

## 2023-11-13 DIAGNOSIS — M545 Low back pain, unspecified: Secondary | ICD-10-CM

## 2023-11-13 DIAGNOSIS — L732 Hidradenitis suppurativa: Secondary | ICD-10-CM

## 2023-11-13 DIAGNOSIS — F4310 Post-traumatic stress disorder, unspecified: Secondary | ICD-10-CM

## 2023-11-13 MED ORDER — SALICYLIC ACID 6 % EX GEL
Freq: Every day | CUTANEOUS | 0 refills | Status: DC
Start: 2023-11-13 — End: 2023-11-13

## 2023-11-13 MED ORDER — SALICYLIC ACID 6 % EX GEL
Freq: Every day | CUTANEOUS | 0 refills | Status: AC
Start: 2023-11-13 — End: ?
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
## 2023-11-13 NOTE — Patient Instructions (Signed)
 For dermatology: [phone] to make an appointment.

## 2023-11-19 ENCOUNTER — Ambulatory Visit (INDEPENDENT_AMBULATORY_CARE_PROVIDER_SITE_OTHER): Admitting: Pediatrics

## 2023-11-19 ENCOUNTER — Encounter: Payer: Self-pay | Admitting: Pediatrics

## 2023-11-19 VITALS — BP 136/80 | HR 93 | Temp 98.3°F | Ht 69.0 in | Wt 333.2 lb

## 2023-11-19 DIAGNOSIS — M545 Low back pain, unspecified: Secondary | ICD-10-CM

## 2023-11-19 DIAGNOSIS — L732 Hidradenitis suppurativa: Secondary | ICD-10-CM

## 2023-11-19 DIAGNOSIS — F4310 Post-traumatic stress disorder, unspecified: Secondary | ICD-10-CM | POA: Diagnosis not present

## 2023-11-19 NOTE — Patient Instructions (Signed)
 For a dermatology appointment, please call [phone] to make an appointment.
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
## 2023-11-20 ENCOUNTER — Encounter: Payer: Self-pay | Admitting: Pediatrics

## 2023-11-21 NOTE — Progress Notes (Signed)
 Patient ID: Deanna Barnes, female   DOB: [birth year], 28 y.o.   MRN: [id number] CC: Right Breast abscess History of Present Illness Deanna Barnes is a 28 y.o. female with past medical history of hidradenitis suppurativa who presents in consultation for right breast abscess.  The patient reports that over the last week she has had increased pain in her right breast and right side.  She describes it as increased redness with some swelling.  She denies any fevers or chills.  She denies any nausea or vomiting.  She denies any drainage from the area.  She went to her primary care doctor and was actually started on Bactrim .  She is on suppressive doxycycline  but this was switched to the Bactrim  she prescribed..  Past Medical History Past Medical History:  Diagnosis Date   Abscess of right breast 07/16/2022   Allergy 2012   Food allergy-Broccoli   Anemia    Asthma        Past Surgical History:  Procedure Laterality Date   BREAST SURGERY  2024   Abcess removed right Breast   WISDOM TOOTH EXTRACTION      Allergies  Allergen Reactions   Brassica Oleracea     Current Outpatient Medications  Medication Sig Dispense Refill   chlorhexidine  (HIBICLENS ) 4 % external liquid Apply topically daily as needed. 118 mL 3   Clindamycin  Phos, Once-Daily, (CLINDAGEL) 1 % GEL Apply 1 Units topically as needed. Apply to active lesions 75 mL 2   ibuprofen  (ADVIL ) 600 MG tablet Take 1 tablet (600 mg total) by mouth every 8 (eight) hours as needed. 30 tablet 0   salicylic acid  6 % gel Apply topically daily. 100 g 0   spironolactone  (ALDACTONE ) 100 MG tablet Take 1 tablet (100 mg total) by mouth daily. 90 tablet 3   traZODone  (DESYREL ) 50 MG tablet Take 0.5-1 tablets (25-50 mg total) by mouth at bedtime as needed for sleep. 30 tablet 3   No current facility-administered medications for this visit.    Family History Family History  Problem Relation Age of Onset   Asthma Mother    Diabetes Father     Hypertension Father    Alcohol abuse Maternal Grandmother    Cancer Maternal Grandmother    COPD Maternal Grandmother    Stroke Maternal Grandmother        Social History Social History   Tobacco Use   Smoking status: Former   Smokeless tobacco: Never  Advertising Account Planner   Vaping status: Never Used  Substance Use Topics   Alcohol use: No   Drug use: Never    Types: Marijuana        ROS Full ROS of systems performed and is otherwise negative there than what is stated in the HPI  Physical Exam Blood pressure (!) 145/79, pulse 80, temperature 97.8 F (36.6 C), temperature source Oral, height 5' 9 (1.753 m), weight (!) 333 lb (151 kg), last menstrual period 09/21/2023, SpO2 98%.  Breast exam performed in the presence of a chaperone.  Right breast there is areas of erythema with some scarring consistent with hidradenitis, no fluctuance appreciated.  I also placed an ultrasound probe over the right breast to see if there is any fluid collections and there was no evidence of fluid collection.  Data Reviewed Ultrasound probe placed over right breast and there is no evidence of abscess.  I have personally reviewed the patient's imaging and medical records.    Assessment/Plan    Patient with increased  redness and swelling over the right breast and has a history of hidradenitis.  At this time there is no evidence of fluctuance to suggest an abscess.  Recommend continue course of Bactrim  and follow-up with dermatology for treatment of hidradenitis   Jayson MALVA Endow

## 2023-11-21 NOTE — Telephone Encounter (Signed)
 At this time no records have been received. If/when received they will be given to provider to review.

## 2023-11-25 ENCOUNTER — Other Ambulatory Visit: Payer: Self-pay | Admitting: Pediatrics

## 2023-11-25 DIAGNOSIS — L732 Hidradenitis suppurativa: Secondary | ICD-10-CM

## 2023-11-26 NOTE — Telephone Encounter (Signed)
 Requested medication (s) are due for refill today: Pharmacy comment: Alternative Requested:PRODUCT NOT COVERED BY MEDICAID.   Requested medication (s) are on the active medication list: yes  Last refill:  07/17/23  Future visit scheduled: yes  Notes to clinic:  Pharmacy comment: Alternative Requested:PRODUCT NOT COVERED BY MEDICAID.      Requested Prescriptions  Pending Prescriptions Disp Refills   Clindamycin  Phos, Once-Daily, 1 % GEL [Pharmacy Med Name: CLINDAMYCIN  PHOSPHATE 1% GEL] 75 mL 2    Sig: Apply 1 Units topically as needed. Apply to active lesions     Off-Protocol Failed - 11/26/2023  2:55 PM      Failed - Medication not assigned to a protocol, review manually.      Passed - Valid encounter within last 12 months    Recent Outpatient Visits           1 week ago Acute midline low back pain without sciatica   [place] [facility] Hadassah SQUIBB, MD   1 week ago PTSD (post-traumatic stress disorder)   [place] [facility] Hadassah SQUIBB, MD   3 weeks ago Abscess of right breast   [place] [facility] Hadassah SQUIBB, MD   1 month ago Acute otitis externa of left ear, unspecified type   [place] [facility] Hadassah SQUIBB, MD   1 month ago Hidradenitis suppurativa   [place] [facility] Hadassah SQUIBB, MD

## 2023-11-27 ENCOUNTER — Encounter: Payer: Self-pay | Admitting: Pediatrics

## 2023-11-27 ENCOUNTER — Ambulatory Visit: Admitting: Pediatrics

## 2023-11-27 VITALS — BP 133/82 | HR 95 | Temp 98.1°F | Ht 69.0 in | Wt 331.0 lb

## 2023-11-27 DIAGNOSIS — L732 Hidradenitis suppurativa: Secondary | ICD-10-CM | POA: Diagnosis not present

## 2023-11-27 DIAGNOSIS — F4310 Post-traumatic stress disorder, unspecified: Secondary | ICD-10-CM

## 2023-11-27 MED ORDER — FLUOXETINE HCL 10 MG PO CAPS: 10.0000 mg | ORAL_CAPSULE | Freq: Every day | ORAL | 1 refills | Status: DC

## 2023-11-27 MED ORDER — CLINDAMYCIN PHOSPHATE 1 % EX LOTN: TOPICAL_LOTION | Freq: Two times a day (BID) | CUTANEOUS | 0 refills | Status: DC

## 2023-11-27 NOTE — Assessment & Plan Note (Signed)
 Lesion on side opened and draining, new lesion on arm developing. Side lesion hard with fluid, not inflamed. Slight redness likely from drainage. Surgeon advised monitoring for abscess. - Apply petroleum jelly to lesion. - Keep area dry, cover with protective dressing. - Avoid antibiotics unless severe infection. - Refer to surgeon if more painful or swollen by Friday. - Provide supplies for covering and drainage management. - Resolve clindamycin  gel prescription issue with pharmacy.

## 2023-11-27 NOTE — Assessment & Plan Note (Signed)
 Symptoms exacerbated by stressors. Currently in therapy, concerned about transition. Interested in non-habit-forming medication. Discussed Prozac for PTSD, recommended for efficacy and long-acting nature. - Prescribe low-dose Prozac. - Advise daily medication adherence. - Discuss future titration off medication. - Encourage continued therapy, including virtual options.
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
## 2023-11-27 NOTE — Patient Instructions (Signed)
Start prozac 10mg  daily

## 2023-12-03 ENCOUNTER — Other Ambulatory Visit: Payer: Self-pay | Admitting: Pediatrics

## 2023-12-03 ENCOUNTER — Telehealth: Payer: Self-pay | Admitting: Pediatrics

## 2023-12-03 DIAGNOSIS — G8929 Other chronic pain: Secondary | ICD-10-CM
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
## 2023-12-03 NOTE — Progress Notes (Signed)
 Placed PT referral.  [name], MD

## 2023-12-04 ENCOUNTER — Ambulatory Visit: Admitting: Pediatrics

## 2023-12-04 NOTE — Telephone Encounter (Signed)
 Are you able to put this referral for the Chiro in and I'll see if they can use it.

## 2023-12-06 ENCOUNTER — Encounter: Payer: Self-pay | Admitting: Pediatrics

## 2023-12-06 ENCOUNTER — Ambulatory Visit: Admitting: Pediatrics

## 2023-12-06 VITALS — BP 129/87 | HR 98 | Temp 98.1°F | Ht 69.0 in | Wt 334.2 lb

## 2023-12-06 DIAGNOSIS — M545 Low back pain, unspecified: Secondary | ICD-10-CM

## 2023-12-06 DIAGNOSIS — L732 Hidradenitis suppurativa: Secondary | ICD-10-CM | POA: Diagnosis not present

## 2023-12-06 DIAGNOSIS — G8929 Other chronic pain: Secondary | ICD-10-CM

## 2023-12-06 DIAGNOSIS — F4310 Post-traumatic stress disorder, unspecified: Secondary | ICD-10-CM | POA: Diagnosis not present

## 2023-12-06 MED ORDER — HYDROXYZINE HCL 10 MG PO TABS: 5.0000 mg | ORAL_TABLET | Freq: Three times a day (TID) | ORAL | 1 refills | Status: AC | PRN

## 2023-12-06 NOTE — Patient Instructions (Addendum)
 Talk to chiropractor about cymbalta benefits   I am also sending hydroxizine 10mg  as needed. Feel free to break this in half if you find it too sedating. Max 30mg  total per day (three of the 10mg  tabs).
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
## 2023-12-11 ENCOUNTER — Telehealth: Payer: Self-pay | Admitting: Pediatrics

## 2023-12-11 NOTE — Telephone Encounter (Signed)
 Copied from CRM #[id number]. Topic: Referral - Status >> Dec 09, 2023  3:54 PM Amy B wrote: Reason for CRM: Patient requests a change to her current referral for physical therapy to include Right hip, Left shoulder and Low back.  Please send referral to [facility] in [place] fax # [phone].

## 2023-12-12 ENCOUNTER — Telehealth: Payer: Self-pay | Admitting: Pediatrics

## 2023-12-12 NOTE — Telephone Encounter (Signed)
 Copied from CRM #[id number]. Topic: Clinical - Medical Advice >> Dec 12, 2023  3:11 PM Shereese L wrote: Reason for CRM: Patient requesting a call back in reference to papers that need to be filled out

## 2023-12-12 NOTE — Telephone Encounter (Signed)
 Routing to provider. Can referral be updated as requested by the patient?

## 2023-12-17 ENCOUNTER — Encounter: Payer: Self-pay | Admitting: Pediatrics

## 2023-12-17 ENCOUNTER — Ambulatory Visit: Admitting: Pediatrics

## 2023-12-17 DIAGNOSIS — G8929 Other chronic pain: Secondary | ICD-10-CM | POA: Insufficient documentation

## 2023-12-17 NOTE — Assessment & Plan Note (Signed)
 Pain exacerbated by prolonged sitting and driving. X-ray shows scoliosis and pelvic asymmetry causing nerve impingement. Chiropractic adjustments beneficial but cause temporary limping. - Continue chiropractic adjustments three times a week, reduce frequency as symptoms improve. - Use walking stick or brace for support, especially after driving. - Initiate physical therapy to prevent muscle loss and manage pain. - Consider Cymbalta for nerve-related pain if physical therapy exacerbates symptoms.

## 2023-12-17 NOTE — Assessment & Plan Note (Addendum)
 Interested in PRN medication for acute symptoms. Hydroxyzine  suggested as PRN option, Cymbalta considered for PTSD and chronic pain. - Prescribed hydroxyzine  as a PRN option for PTSD symptoms. - Consider Cymbalta for PTSD and chronic pain management if needed.

## 2023-12-17 NOTE — Assessment & Plan Note (Signed)
 Current dermatologist not specialized in HS. Previous treatment with doxycycline  and spironolactone  effective, but spironolactone  stopped, expected to cause flare-ups. - Continue doxycycline  as prescribed. - Seek appointment with a specialist in hidradenitis suppurativa. - Monitor for flare-ups after stopping spironolactone .

## 2023-12-18 ENCOUNTER — Ambulatory Visit: Admitting: Pediatrics

## 2023-12-18 NOTE — Telephone Encounter (Signed)
Paperwork completed and given to patient.

## 2023-12-23 ENCOUNTER — Other Ambulatory Visit: Payer: Self-pay | Admitting: Pediatrics

## 2023-12-23 DIAGNOSIS — M25551 Pain in right hip: Secondary | ICD-10-CM

## 2023-12-23 DIAGNOSIS — G8929 Other chronic pain: Secondary | ICD-10-CM

## 2023-12-23 DIAGNOSIS — M545 Low back pain, unspecified: Secondary | ICD-10-CM

## 2023-12-23 NOTE — Progress Notes (Signed)
 Updated PT order for [name]  Deanna SHAUNNA Nett, MD

## 2023-12-23 NOTE — Telephone Encounter (Signed)
 Referral completed. Patient to be contacted once it has fully processed.

## 2024-01-01 ENCOUNTER — Encounter: Payer: Self-pay | Admitting: Pediatrics

## 2024-01-01 ENCOUNTER — Ambulatory Visit: Admitting: Pediatrics

## 2024-01-01 VITALS — BP 129/77 | HR 67 | Temp 98.2°F | Ht 69.0 in | Wt 340.4 lb

## 2024-01-01 DIAGNOSIS — G8929 Other chronic pain: Secondary | ICD-10-CM

## 2024-01-01 DIAGNOSIS — M545 Low back pain, unspecified: Secondary | ICD-10-CM

## 2024-01-01 DIAGNOSIS — F4310 Post-traumatic stress disorder, unspecified: Secondary | ICD-10-CM | POA: Diagnosis not present

## 2024-01-01 DIAGNOSIS — L732 Hidradenitis suppurativa: Secondary | ICD-10-CM

## 2024-01-01 MED ORDER — CYCLOBENZAPRINE HCL 10 MG PO TABS: 10.0000 mg | ORAL_TABLET | Freq: Three times a day (TID) | ORAL | 0 refills | Status: DC | PRN

## 2024-01-01 MED ORDER — MELOXICAM 15 MG PO TABS: 15.0000 mg | ORAL_TABLET | Freq: Every day | ORAL | 1 refills | Status: AC

## 2024-01-01 MED ORDER — CYCLOBENZAPRINE HCL 10 MG PO TABS: 10.0000 mg | ORAL_TABLET | Freq: Three times a day (TID) | ORAL | 1 refills | Status: AC | PRN
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
## 2024-01-02 ENCOUNTER — Telehealth: Payer: Self-pay | Admitting: Pediatrics

## 2024-01-02 NOTE — Telephone Encounter (Unsigned)
 Copied from CRM #[id number]. Topic: Referral - Question >> Jan 02, 2024 11:32 AM Antony RAMAN wrote: Reason for CRM: correct address for referral - [address] [place] [address] phone- [phone]

## 2024-01-04 ENCOUNTER — Encounter: Payer: Self-pay | Admitting: Pediatrics

## 2024-01-04 NOTE — Assessment & Plan Note (Signed)
 Persistent with flare-ups. Previous treatment with salicylic acid  was effective. Specialist appointment scheduled. - Continue salicylic acid  treatment until specialist appointment. - Maintain appointment with HS specialist in January.

## 2024-01-04 NOTE — Assessment & Plan Note (Signed)
 Pain exacerbated by stress and activity. Previous chiropractic adjustments caused adverse effects. Current management inadequate. MRI pending for further evaluation. - Prescribed Flexeril  for muscle relaxation. - Prescribed meloxicam  for pain management. - Referred to [facility] for advanced pain management. - Advised to keep appointment with primary care provider at [facility] for potential expedited referral. - Encouraged to message if Flexeril  is ineffective.

## 2024-01-04 NOTE — Assessment & Plan Note (Signed)
 Established with therapist. Has atarax  prn. Did not start prozac . Consider duloxetine for pain and mood management.

## 2024-01-07 NOTE — Progress Notes (Signed)
 Sent My-Chart message

## 2024-01-13 NOTE — Telephone Encounter (Signed)
 Request forwarded already to referral team.

## 2024-01-13 NOTE — Telephone Encounter (Unsigned)
 Copied from CRM #[id number]. Topic: General - Other >> Jan 13, 2024 12:54 PM Winona R wrote: Pt is calling with [facility] member services to ask if the office can withdraw the MRI request. It took to long to get and now I'm having issues with the new primary ordering one with insurance. Thanks

## 2024-01-13 NOTE — Telephone Encounter (Signed)
 Albino- can we cancel this with her insurance please, I've cancelled it in the system. Thanks!

## 2024-01-27 ENCOUNTER — Telehealth: Payer: Self-pay | Admitting: *Deleted

## 2024-01-27 NOTE — Patient Outreach (Signed)
 Phone call from patient stating that she has recently moved to [facility], [place] and needed resources for mental health support. Patient provided with list of resources in the [facility] area. Patient will also follow up with her doctor in [place] in the event that a referral is needed.   Dinari Stgermaine, LCSW   [facility], [facility] Licensed Clinical Social Worker  Direct Dial: [phone]
